# Patient Record
Sex: Male | Born: 1950
Health system: Southern US, Community
[De-identification: ages and names within clinical notes are randomized; demographics above are authoritative.]

## PROBLEM LIST (undated history)

## (undated) DIAGNOSIS — E785 Hyperlipidemia, unspecified: Secondary | ICD-10-CM

## (undated) DIAGNOSIS — I10 Essential (primary) hypertension: Secondary | ICD-10-CM

---

## 2004-03-18 ENCOUNTER — Ambulatory Visit: Payer: Self-pay | Admitting: Gastroenterology

## 2011-03-18 ENCOUNTER — Emergency Department
Admit: 2011-03-18 | Discharge: 2011-03-18 | Disposition: A | Discharge status: Home / Self Care | Payer: PRIVATE HEALTH INSURANCE

## 2011-03-18 ENCOUNTER — Emergency Department
Admission: EM | Admit: 2011-03-18 | Discharge: 2011-03-18 | Disposition: A | Discharge status: Home / Self Care | Payer: PRIVATE HEALTH INSURANCE | Source: Home / Self Care | Attending: Emergency Medicine | Admitting: Emergency Medicine

## 2011-03-18 DIAGNOSIS — R05 Cough: Secondary | ICD-10-CM

## 2011-03-18 DIAGNOSIS — R059 Cough, unspecified: Secondary | ICD-10-CM

## 2011-03-18 DIAGNOSIS — J209 Acute bronchitis, unspecified: Secondary | ICD-10-CM

## 2011-03-18 DIAGNOSIS — R0602 Shortness of breath: Secondary | ICD-10-CM

## 2011-03-18 HISTORY — DX: Hyperlipidemia, unspecified: E78.5

## 2011-03-18 MED ORDER — PREDNISONE (PAK) 10 MG PO TABS: 10.0000 mg | ORAL_TABLET | Freq: Every day | ORAL | Status: AC

## 2011-03-18 MED ORDER — ALBUTEROL SULFATE HFA 108 (90 BASE) MCG/ACT IN AERS: 1.0000 | INHALATION_SPRAY | Freq: Four times a day (QID) | RESPIRATORY_TRACT | Status: DC | PRN

## 2011-03-18 NOTE — ED Notes (Signed)
Currently on amoxicillin

## 2011-03-18 NOTE — ED Notes (Signed)
Sinus infection started 2 weeks ago, now having wheezing and coughing up yellow sputum

## 2011-03-18 NOTE — ED Provider Notes (Signed)
History     CSN: [id number] Arrival date & time: 03/18/2011 10:54 AM   First MD Initiated Contact with Patient 03/18/11 1049      Chief Complaint  Patient presents with  . Facial Pain    (Consider location/radiation/quality/duration/timing/severity/associated sxs/prior treatment) HPI William Lane is a 60 y.o. male who complains of onset of cold symptoms for 2-3 days. However, even before that he has had a lingering nasal congestion for the last 2-3 weeks. He went to the nurse at his work who gave him Augmentin for a sinus infection. However he is concerned that he may also have pneumonia since he has been wheezing and coughing a lot recently as well. He does not have any history of asthma or allergies. He is on day 3 of the antibiotics and thinks that the facial pressure symptoms are improving.  No sore throat + cough No pleuritic pain + wheezing + nasal congestion + post-nasal drainage + sinus pain/pressure + chest congestion No itchy/red eyes No earache No hemoptysis + SOB No chills/sweats No fever No nausea No vomiting No abdominal pain No diarrhea No skin rashes + fatigue No myalgias No headache    Past Medical History  Diagnosis Date  . Hyperlipidemia     No past surgical history on file.  No family history on file.  History  Substance Use Topics  . Smoking status: Never Smoker   . Smokeless tobacco: Not on file  . Alcohol Use: Yes      Review of Systems  Allergies  Review of patient's allergies indicates no known allergies.  Home Medications   Current Outpatient Rx  Name Route Sig Dispense Refill  . ATORVASTATIN CALCIUM 40 MG PO TABS Oral Take 40 mg by mouth daily.        BP 146/100  Pulse 81  Temp(Src) 98.7 F (37.1 C) (Oral)  Resp 20  Ht 5\' 10"  (1.778 m)  Wt 199 lb 8 oz (90.493 kg)  BMI 28.63 kg/m2  SpO2 96%  Physical Exam  Nursing note and vitals reviewed. Constitutional: He is oriented to person, place, and time. He appears  well-developed and well-nourished.  HENT:  Head: Normocephalic and atraumatic.  Right Ear: Tympanic membrane, external ear and ear canal normal.  Left Ear: Tympanic membrane, external ear and ear canal normal.  Nose: Mucosal edema and rhinorrhea present.  Mouth/Throat: Posterior oropharyngeal erythema present. No oropharyngeal exudate or posterior oropharyngeal edema.  Eyes: No scleral icterus.  Neck: Neck supple.  Cardiovascular: Regular rhythm and normal heart sounds.   Pulmonary/Chest: Effort normal. No respiratory distress. He has no decreased breath sounds. He has wheezes. He has rhonchi.  Neurological: He is alert and oriented to person, place, and time.  Skin: Skin is warm and dry.  Psychiatric: He has a normal mood and affect. His speech is normal.    ED Course  Procedures (including critical care time)  Labs Reviewed - No data to display No results found.   1. Cough   2. Shortness of breath   3. Bronchitis, acute       MDM  An X-ray was ordered and read by the radiologist as above. it reads as negative for pneumonia.  1)   continue the current antibiotic as already taking.  2)  Use nasal saline solution (over the counter) at least 3 times a day. 3)  Use over the counter decongestants like Zyrtec-D every 12 hours as needed to help with congestion.  If you have hypertension, do not take medicines  with sudafed.  4)  Can take tylenol every 6 hours or motrin every 8 hours for pain or fever. 5)  Follow up with your primary doctor if no improvement in 5-7 days, sooner if increasing pain, fever, or new symptoms.      Lily Kocher, MD 03/18/11 1130

## 2012-05-15 ENCOUNTER — Emergency Department
Admission: EM | Admit: 2012-05-15 | Discharge: 2012-05-15 | Disposition: A | Discharge status: Home / Self Care | Payer: PRIVATE HEALTH INSURANCE | Source: Home / Self Care | Attending: Family Medicine | Admitting: Family Medicine

## 2012-05-15 ENCOUNTER — Encounter: Payer: Self-pay | Admitting: Emergency Medicine

## 2012-05-15 DIAGNOSIS — J329 Chronic sinusitis, unspecified: Secondary | ICD-10-CM

## 2012-05-15 DIAGNOSIS — J309 Allergic rhinitis, unspecified: Secondary | ICD-10-CM

## 2012-05-15 HISTORY — DX: Essential (primary) hypertension: I10

## 2012-05-15 MED ORDER — FLUTICASONE PROPIONATE 50 MCG/ACT NA SUSP: 2.0000 | Freq: Every day | NASAL | Status: DC

## 2012-05-15 MED ORDER — AMOXICILLIN-POT CLAVULANATE 875-125 MG PO TABS: 1.0000 | ORAL_TABLET | Freq: Two times a day (BID) | ORAL | Status: DC

## 2012-05-15 NOTE — ED Notes (Signed)
Sinus pain, pressure, headache, sneezing, congestion x 3 days

## 2012-05-15 NOTE — ED Provider Notes (Signed)
History     CSN: [id number]  Arrival date & time 05/15/12  4098   First MD Initiated Contact with Patient 05/15/12 [phone]      Chief Complaint  Patient presents with  . Sinus Problem   HPI  URI Symptoms Onset: 3 days  Description: sinus drainage and pressure, sneezing.  Modifying factors:  Prior hx/o allergies. Started zyrtec with marked improvement in sxs.   Symptoms Nasal discharge: yes Fever: no Sore throat: no Cough: no Wheezing: no Ear pain: no GI symptoms: no Sick contacts: yes  Red Flags  Stiff neck: no Dyspnea: no Rash: no Swallowing difficulty: no  Sinusitis Risk Factors Headache/face pain: mild Double sickening: no tooth pain: no  Allergy Risk Factors Sneezing: yes Itchy scratchy throat: yes Seasonal symptoms: yes  Flu Risk Factors Headache: no muscle aches: no severe fatigue: no   Past Medical History  Diagnosis Date  . Hyperlipidemia   . Hypertension     History reviewed. No pertinent past surgical history.  Family History  Problem Relation Age of Onset  . Alzheimer's disease Mother     History  Substance Use Topics  . Smoking status: Never Smoker   . Smokeless tobacco: Not on file  . Alcohol Use: Yes      Review of Systems  All other systems reviewed and are negative.    Allergies  Review of patient's allergies indicates not on file.  Home Medications   Current Outpatient Rx  Name  Route  Sig  Dispense  Refill  . aspirin 81 MG tablet   Oral   Take 81 mg by mouth daily.         . cetirizine (ZYRTEC) 10 MG tablet   Oral   Take 10 mg by mouth daily.         . niacin (NIASPAN) 500 MG CR tablet   Oral   Take 500 mg by mouth at bedtime.         Marland Kitchen EXPIRED: albuterol (PROVENTIL HFA;VENTOLIN HFA) 108 (90 BASE) MCG/ACT inhaler   Inhalation   Inhale 1-2 puffs into the lungs every 6 (six) hours as needed for wheezing.   1 Inhaler   0   . amoxicillin-clavulanate (AUGMENTIN) 875-125 MG per tablet   Oral   Take  1 tablet by mouth 2 (two) times daily.   20 tablet   0   . atorvastatin (LIPITOR) 40 MG tablet   Oral   Take 40 mg by mouth daily.           . fluticasone (FLONASE) 50 MCG/ACT nasal spray   Nasal   Place 2 sprays into the nose daily.   16 g   12     BP 143/95  Pulse 86  Temp(Src) 97.8 F (36.6 C) (Oral)  Ht 5\' 10"  (1.778 m)  Wt 196 lb (88.905 kg)  BMI 28.12 kg/m2  SpO2 98%  Physical Exam  Constitutional: He appears well-developed and well-nourished.  HENT:  Head: Normocephalic and atraumatic.  Right Ear: External ear normal.  Left Ear: External ear normal.  +nasal erythema, rhinorrhea bilaterally, + post oropharyngeal erythema    Eyes: Conjunctivae are normal. Pupils are equal, round, and reactive to light.  Neck: Normal range of motion. Neck supple.  Cardiovascular: Normal rate, regular rhythm and normal heart sounds.   Pulmonary/Chest: Effort normal and breath sounds normal.  Abdominal: Soft. Bowel sounds are normal.  Musculoskeletal: Normal range of motion.  Lymphadenopathy:    He has no cervical adenopathy.  Neurological: He is alert.  Skin: Skin is warm.    ED Course  Procedures (including critical care time)  Labs Reviewed - No data to display No results found.   1. Allergic rhinitis   2. Sinusitis       MDM  Sxs predominantly allergic in origin.  Continue zyrtec.  Start flonase.  Augmentin if sxs not improved in next 5-7 days.  Discussed general care and infectious/resp red flags.  Follow up as needed.     The patient and/or caregiver has been counseled thoroughly with regard to treatment plan and/or medications prescribed including dosage, schedule, interactions, rationale for use, and possible side effects and they verbalize understanding. Diagnoses and expected course of recovery discussed and will return if not improved as expected or if the condition worsens. Patient and/or caregiver verbalized understanding.              Doree Albee, MD 05/15/12 [phone]

## 2012-08-01 LAB — BASIC METABOLIC PANEL
Creatinine: 1 mg/dL (ref 0.6–1.3)
Glucose: 98 mg/dL
POTASSIUM: 4.1 mmol/L (ref 3.4–5.3)

## 2012-08-01 LAB — HEMOGLOBIN A1C: HEMOGLOBIN A1C: 5.6 % (ref 4.0–6.0)

## 2012-11-29 IMAGING — CR DG CHEST 2V
2 series · 2 of 2 positions shown · non-contrast
Comparison: None.

CLINICAL DATA: Shortness of breath.  Cough, wheeze.  Rule out
pneumonia.  Symptoms for 2 weeks.

CHEST - 2 VIEW

[view not recorded (1 of 2)]
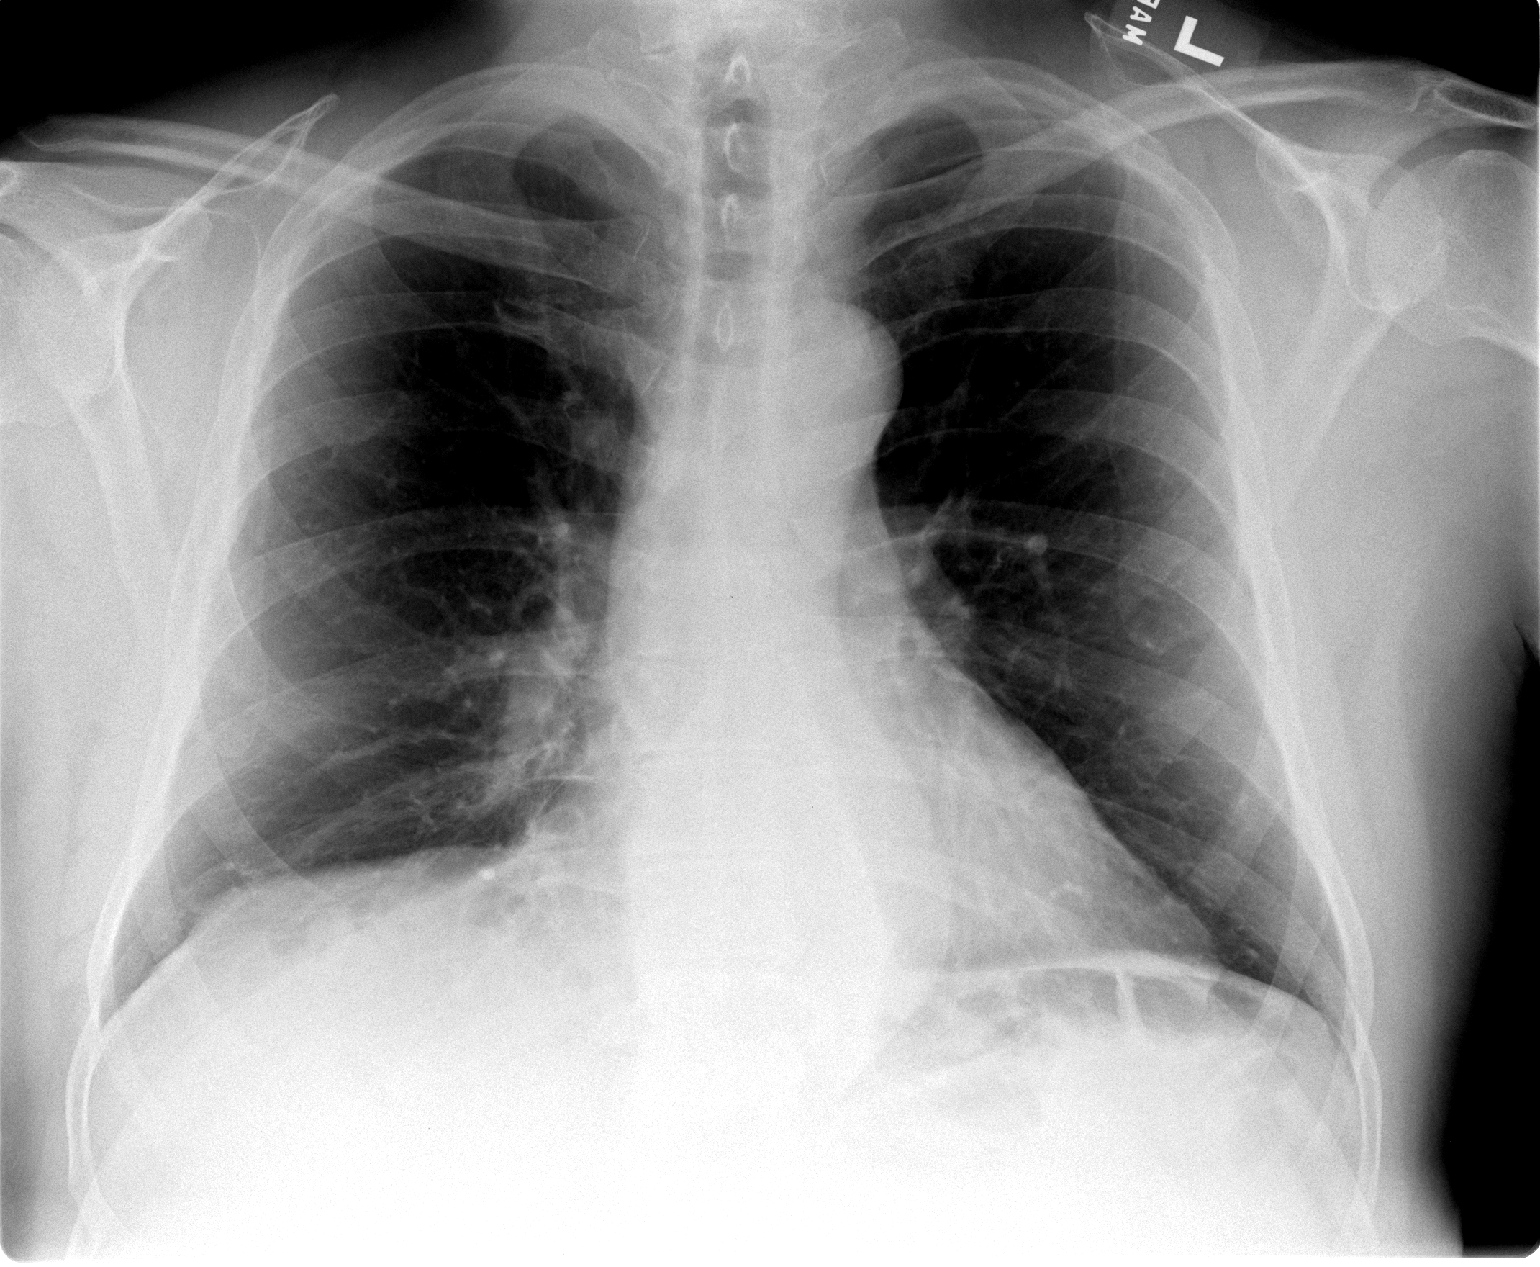

[view not recorded (2 of 2)]
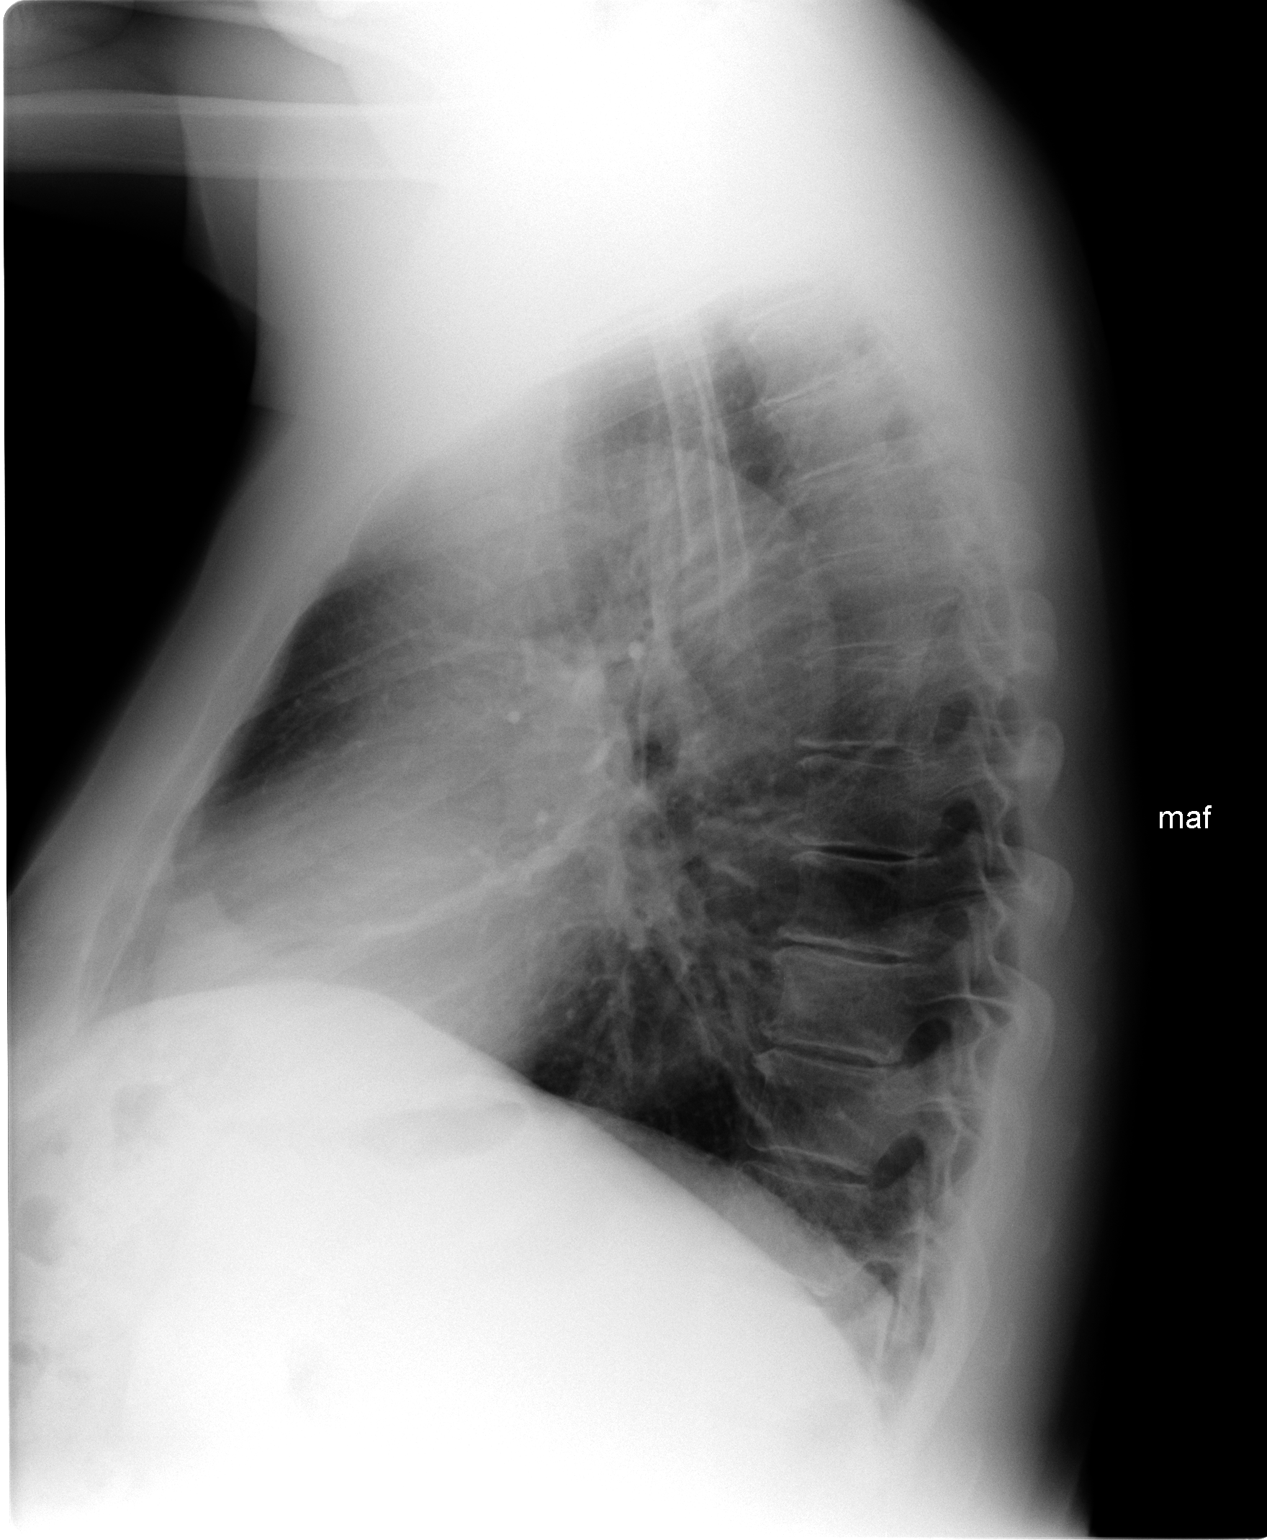

[2 of 2 positions shown; findings below may reference images not displayed]

FINDINGS: Cardiomediastinal silhouette is within normal limits.
The lungs are free of focal consolidations and pleural effusions.
No pulmonary edema.  Mild degenerate changes are seen in the mid
thoracic spine.
IMPRESSION: Negative exam.

## 2013-07-29 LAB — LIPID PANEL
CHOLESTEROL: 173 mg/dL (ref 0–200)
HDL: 63 mg/dL (ref 35–70)
LDL Cholesterol: 96 mg/dL
Triglycerides: 70 mg/dL (ref 40–160)

## 2013-07-29 LAB — PSA: PSA: 5.8

## 2013-07-29 LAB — HEPATIC FUNCTION PANEL
ALT: 29 U/L (ref 10–40)
AST: 30 U/L (ref 14–40)

## 2013-07-29 LAB — BASIC METABOLIC PANEL: Glucose: 106 mg/dL

## 2013-07-29 LAB — HEMOGLOBIN A1C: HEMOGLOBIN A1C: 5.5 % (ref 4.0–6.0)

## 2013-07-29 LAB — IRON: IRON: 159

## 2014-01-12 ENCOUNTER — Encounter: Payer: Self-pay | Admitting: Family Medicine

## 2014-01-12 ENCOUNTER — Ambulatory Visit (INDEPENDENT_AMBULATORY_CARE_PROVIDER_SITE_OTHER): Payer: BC Managed Care – PPO | Admitting: Family Medicine

## 2014-01-12 VITALS — BP 150/101 | HR 73 | Ht 70.0 in | Wt 199.0 lb

## 2014-01-12 DIAGNOSIS — E785 Hyperlipidemia, unspecified: Secondary | ICD-10-CM

## 2014-01-12 DIAGNOSIS — R972 Elevated prostate specific antigen [PSA]: Secondary | ICD-10-CM | POA: Insufficient documentation

## 2014-01-12 DIAGNOSIS — I10 Essential (primary) hypertension: Secondary | ICD-10-CM | POA: Diagnosis not present

## 2014-01-12 DIAGNOSIS — Z1211 Encounter for screening for malignant neoplasm of colon: Secondary | ICD-10-CM

## 2014-01-12 DIAGNOSIS — N400 Enlarged prostate without lower urinary tract symptoms: Secondary | ICD-10-CM

## 2014-01-12 LAB — VITAMIN D 25 HYDROXY (VIT D DEFICIENCY, FRACTURES): Vit D, 25-Hydroxy: 24

## 2014-01-12 MED ORDER — DOXAZOSIN MESYLATE 2 MG PO TABS: 2.0000 mg | ORAL_TABLET | Freq: Every day | ORAL | Status: DC

## 2014-01-12 NOTE — Progress Notes (Signed)
CC: William Lane is a 63 y.o. male is here for Establish Care   Subjective: HPI:  Pleasant 63 year old here to establish care. "Rennie like grow"  Reports a long-standing history of hyperlipidemia. He was once on atorvastatin 80 mg daily however developed diffuse joint pain. His cholesterol was well controlled on 40 mg daily however 3 months ago he decided to take only 40 mg 3 times a week. He has not had blood work done since this change. Denies joint pain or myalgias currently. Denies right upper quadrant pain nor chest pain  Present history of borderline hypertension. He's never been on anything the lower blood pressure before. He states active on a daily basis playing golf. Denies chest pain shortness of breath orthopnea nor peripheral edema  Reports a history of elevated PSA most recently 5-1/2. He has a history of this being between 3-4 and with this recent elevation had a prostate biopsy which was normal. He reports urinary urgency weak stream and a sensation of incomplete bladder emptying on a daily basis to a moderate degree there has been present for months and was present prior to his biopsy.  Review of Systems - General ROS: negative for - chills, fever, night sweats, weight gain or weight loss Ophthalmic ROS: negative for - decreased vision Psychological ROS: negative for - anxiety or depression ENT ROS: negative for - hearing change, nasal congestion, tinnitus or allergies Hematological and Lymphatic ROS: negative for - bleeding problems, bruising or swollen lymph nodes Breast ROS: negative Respiratory ROS: no cough, shortness of breath, or wheezing Cardiovascular ROS: no chest pain or dyspnea on exertion Gastrointestinal ROS: no abdominal pain, change in bowel habits, or black or bloody stools Genito-Urinary ROS: negative for - genital discharge, genital ulcers, incontinence or abnormal bleeding from genitals Musculoskeletal ROS: negative for - joint pain or muscle  pain Neurological ROS: negative for - headaches or memory loss Dermatological ROS: negative for lumps, mole changes, rash and skin lesion changes  Past Medical History  Diagnosis Date  . Hyperlipidemia   . Hypertension     History reviewed. No pertinent past surgical history. Family History  Problem Relation Age of Onset  . Alzheimer's disease Mother   . Hyperlipidemia      History   Social History  . Marital Status: Married    Spouse Name: N/A    Number of Children: N/A  . Years of Education: N/A   Occupational History  . Not on file.   Social History Main Topics  . Smoking status: Never Smoker   . Smokeless tobacco: Not on file  . Alcohol Use: 5.0 oz/week    10 drink(s) per week  . Drug Use: No  . Sexual Activity: Yes   Other Topics Concern  . Not on file   Social History Narrative  . No narrative on file     Objective: BP 150/101  Pulse 73  Ht 5\' 10"  (1.778 m)  Wt 199 lb (90.266 kg)  BMI 28.55 kg/m2  General: Alert and Oriented, No Acute Distress HEENT: Pupils equal, round, reactive to light. Conjunctivae clear.  External ears unremarkable, canals clear with intact TMs with appropriate landmarks.  Middle ear appears open without effusion. Pink inferior turbinates.  Moist mucous membranes, pharynx without inflammation nor lesions.  Neck supple without palpable lymphadenopathy nor abnormal masses. Lungs: Clear to auscultation bilaterally, no wheezing/ronchi/rales.  Comfortable work of breathing. Good air movement. Cardiac: Regular rate and rhythm. Normal S1/S2.  No murmurs, rubs, nor gallops.  Mental Status: No depression, anxiety, nor agitation. Skin: Warm and dry.  Assessment & Plan: Dorinda HillDonald was seen today for establish care.  Diagnoses and associated orders for this visit:  Colon cancer screening - Ambulatory referral to Gastroenterology  Hyperlipidemia - Lipid panel  Essential hypertension - doxazosin (CARDURA) 2 MG tablet; Take 1 tablet (2 mg  total) by mouth daily.  Elevated PSA - PSA  BPH (benign prostatic hyperplasia) - doxazosin (CARDURA) 2 MG tablet; Take 1 tablet (2 mg total) by mouth daily.    It's been 10 years since his last colon cancer screening, we'll refer him to a local GI office for consideration of colonoscopy Hyperlipidemia: Due for repeat lipid panel continue 40 mg 2 times a day pending results Essential hypertension: Uncontrolled starting doxazosin. BPH  With a history of elevated PSA: Repeat PSA today, doxazosin to help with enlarged prostate symptoms.   Return in about 3 months (around 04/14/2014) for BP Follow Up.

## 2014-01-22 ENCOUNTER — Encounter: Payer: Self-pay | Admitting: Family Medicine

## 2014-02-03 ENCOUNTER — Other Ambulatory Visit: Payer: Self-pay | Admitting: Family Medicine

## 2014-02-03 LAB — LIPID PANEL
CHOL/HDL RATIO: 3.6 ratio
Cholesterol: 212 mg/dL — ABNORMAL HIGH (ref 0–200)
HDL: 59 mg/dL (ref >39)
LDL CALC: 129 mg/dL — AB (ref 0–99)
Triglycerides: 119 mg/dL (ref <150)
VLDL: 24 mg/dL (ref 0–40)

## 2014-02-03 LAB — PSA: PSA: 6.24 ng/mL — ABNORMAL HIGH (ref <=4.00)

## 2014-02-19 ENCOUNTER — Encounter: Payer: Self-pay | Admitting: Family Medicine

## 2014-04-04 ENCOUNTER — Encounter: Payer: Self-pay | Admitting: Family Medicine

## 2014-04-10 ENCOUNTER — Encounter: Payer: Self-pay | Admitting: Family Medicine

## 2014-04-13 ENCOUNTER — Ambulatory Visit (INDEPENDENT_AMBULATORY_CARE_PROVIDER_SITE_OTHER): Payer: BLUE CROSS/BLUE SHIELD | Admitting: Family Medicine

## 2014-04-13 ENCOUNTER — Encounter: Payer: Self-pay | Admitting: Family Medicine

## 2014-04-13 VITALS — BP 143/96 | HR 64 | Ht 70.0 in | Wt 204.0 lb

## 2014-04-13 DIAGNOSIS — I10 Essential (primary) hypertension: Secondary | ICD-10-CM

## 2014-04-13 DIAGNOSIS — E785 Hyperlipidemia, unspecified: Secondary | ICD-10-CM

## 2014-04-13 DIAGNOSIS — N400 Enlarged prostate without lower urinary tract symptoms: Secondary | ICD-10-CM | POA: Diagnosis not present

## 2014-04-13 LAB — LIPID PANEL
CHOL/HDL RATIO: 2.9 ratio
CHOLESTEROL: 180 mg/dL (ref 0–200)
HDL: 63 mg/dL (ref >39)
LDL Cholesterol: 104 mg/dL — ABNORMAL HIGH (ref 0–99)
Triglycerides: 63 mg/dL (ref <150)
VLDL: 13 mg/dL (ref 0–40)

## 2014-04-13 MED ORDER — DOXAZOSIN MESYLATE 4 MG PO TABS: 4.0000 mg | ORAL_TABLET | Freq: Every day | ORAL | Status: DC

## 2014-04-13 MED ORDER — NIACIN ER (ANTIHYPERLIPIDEMIC) 1000 MG PO TBCR: 1000.0000 mg | EXTENDED_RELEASE_TABLET | Freq: Every day | ORAL | Status: DC

## 2014-04-13 NOTE — Progress Notes (Signed)
CC: Horatio PelDonald S Eakins is a 64 y.o. male is here for Follow-up   Subjective: HPI:  Follow-up BPH: Denies any sensation of incomplete voiding or difficulty voiding. Awakening at most once a night to urinate. Has No genitourinary complaints today.  Follow essential hypertension: No outside blood pressures report. Since taking doxazosin he denies any known side effects or intolerance. Denies lightheadedness, chest pain, shortness of breath, orthopnea nor peripheral edema. Denies motor or sensory disturbances.  Since I saw him last he began taking atorvastatin 40 mg on a daily basis instead of 3 times a week. He is also taking niacin on a daily basis instead of 3 times a week. He denies any myalgias or right upper quadrant pain. Denies flushing provided he takes aspirin 30 minutes prior to niacin.  Review Of Systems Outlined In HPI  Past Medical History  Diagnosis Date  . Hyperlipidemia   . Hypertension     No past surgical history on file. Family History  Problem Relation Age of Onset  . Alzheimer's disease Mother   . Hyperlipidemia      History   Social History  . Marital Status: Married    Spouse Name: N/A    Number of Children: N/A  . Years of Education: N/A   Occupational History  . Not on file.   Social History Main Topics  . Smoking status: Never Smoker   . Smokeless tobacco: Not on file  . Alcohol Use: 5.0 oz/week    10 drink(s) per week  . Drug Use: No  . Sexual Activity: Yes   Other Topics Concern  . Not on file   Social History Narrative     Objective: BP 143/96 mmHg  Pulse 64  Ht 5\' 10"  (1.778 m)  Wt 204 lb (92.534 kg)  BMI 29.27 kg/m2  General: Alert and Oriented, No Acute Distress HEENT: Pupils equal, round, reactive to light. Conjunctivae clear.  Moist mucous membranes Lungs: Clear to auscultation bilaterally, no wheezing/ronchi/rales.  Comfortable work of breathing. Good air movement. Cardiac: Regular rate and rhythm. Normal S1/S2.  No murmurs,  rubs, nor gallops.  No carotid bruits Extremities: No peripheral edema.  Strong peripheral pulses.  Mental Status: No depression, anxiety, nor agitation. Skin: Warm and dry.  Assessment & Plan: Dorinda HillDonald was seen today for follow-up.  Diagnoses and associated orders for this visit:  BPH (benign prostatic hyperplasia) - doxazosin (CARDURA) 4 MG tablet; Take 1 tablet (4 mg total) by mouth daily.  Essential hypertension - doxazosin (CARDURA) 4 MG tablet; Take 1 tablet (4 mg total) by mouth daily.  Hyperlipidemia - Lipid panel - niacin (NIASPAN) 1000 MG CR tablet; Take 1 tablet (1,000 mg total) by mouth at bedtime.    BPH: Controlled continue doxazosin however increasing due to uncontrolled hypertension. Hypertension: Uncontrolled chronic condition increasing doxazosin Hyperlipidemia: Check a lipid panel continue atorvastatin and niacin on a daily basis pending results   Return in about 3 months (around 07/13/2014) for Blood Pressure.

## 2014-06-19 ENCOUNTER — Other Ambulatory Visit: Payer: Self-pay | Admitting: Family Medicine

## 2014-08-06 ENCOUNTER — Telehealth: Payer: Self-pay | Admitting: Family Medicine

## 2014-08-06 DIAGNOSIS — E559 Vitamin D deficiency, unspecified: Secondary | ICD-10-CM | POA: Insufficient documentation

## 2014-08-06 MED ORDER — VITAMIN D (ERGOCALCIFEROL) 1.25 MG (50000 UNIT) PO CAPS: 50000.0000 [IU] | ORAL_CAPSULE | ORAL | Status: DC

## 2014-08-06 NOTE — Telephone Encounter (Signed)
William Lane, Will you please let patient know that I appreciate him getting the [facility] results to me this week.  Everything looks stable other than a vitamin D deficiency therefore [facility] [address].

## 2014-08-06 NOTE — Telephone Encounter (Signed)
Message left on vm 

## 2014-10-08 ENCOUNTER — Other Ambulatory Visit: Payer: Self-pay | Admitting: Family Medicine

## 2014-10-23 ENCOUNTER — Other Ambulatory Visit: Payer: Self-pay | Admitting: Family Medicine

## 2014-11-07 ENCOUNTER — Encounter: Payer: Self-pay | Admitting: Emergency Medicine

## 2014-11-07 ENCOUNTER — Emergency Department
Admission: EM | Admit: 2014-11-07 | Discharge: 2014-11-07 | Disposition: A | Discharge status: Home / Self Care | Payer: BLUE CROSS/BLUE SHIELD | Source: Home / Self Care | Attending: Family Medicine | Admitting: Family Medicine

## 2014-11-07 DIAGNOSIS — L255 Unspecified contact dermatitis due to plants, except food: Secondary | ICD-10-CM | POA: Diagnosis not present

## 2014-11-07 MED ORDER — TRIAMCINOLONE ACETONIDE 40 MG/ML IJ SUSP
40.0000 mg | Freq: Once | INTRAMUSCULAR | Status: AC
  Administered 2014-11-07: 40 mg via INTRAMUSCULAR

## 2014-11-07 NOTE — ED Notes (Signed)
Patient presents to [facility] with C/O [facility], he states 3 to 4 days ago [place]. Rash noted to hands arms legs and trunk

## 2014-11-07 NOTE — ED Notes (Signed)
Patient discharged with instructions and verbalized an understanding.

## 2014-11-07 NOTE — ED Provider Notes (Signed)
CSN: [id number]     Arrival date & time 11/07/14  0917 History   First MD Initiated Contact with Patient 11/07/14 [phone]     Chief Complaint  Patient presents with  . 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  He has developed a pruritic rash on his arms, lower legs, and abdomen.  Patient is a 64 y.o. Lane presenting with poison ivy. The history is provided by the patient.  Poison [place] This is a new problem. The current episode started more than 2 days ago. The problem occurs constantly. The problem has not changed since onset.Associated symptoms comments: none. Exacerbated by: heat. Nothing relieves the symptoms. He has tried nothing for the symptoms.    Past Medical History  Diagnosis Date  . Hyperlipidemia   . Hypertension    History reviewed. No pertinent past surgical history. Family History  Problem Relation Age of Onset  . Alzheimer's disease Mother   . Hyperlipidemia     History  Substance Use Topics  . Smoking status: Never Smoker   . Smokeless tobacco: Not on file  . Alcohol Use: 5.0 oz/week    10 drink(s) per week    Review of Systems  All other systems reviewed and are negative.   Allergies  Review of patient's allergies indicates no known allergies.  Home Medications   Prior to Admission medications   Medication Sig Start Date End Date Taking? Authorizing Provider  aspirin 81 MG tablet Take 81 mg by mouth daily.    Historical Provider, MD  atorvastatin (LIPITOR) William MG tablet Take 1 tablet (William mg total) by mouth daily. 02/03/14   Sean Hommel, DO  atorvastatin (LIPITOR) 80 MG tablet TAKE 1 TABLET BY MOUTH ONCE A DAY 06/19/14   Sean Hommel, DO  doxazosin (CARDURA) 4 MG tablet TAKE 1 TABLET (4 MG TOTAL) BY MOUTH DAILY. 10/08/14   Laren Boom, DO  niacin (NIASPAN) 1000 MG CR tablet Take 1 tablet (1,000 mg total) by mouth at bedtime. 04/13/14   Laren Boom, DO  Vitamin D, Ergocalciferol,  (DRISDOL) 50000 UNITS CAPS capsule TAKE 1 CAPSULE (50,000 UNITS TOTAL) BY MOUTH EVERY 7 (SEVEN) DAYS. RECHECK VITAMIN D IN 3 MONTHS 10/23/14   Sean Hommel, DO   BP 145/96 mmHg  Pulse 54  Temp(Src) 98.6 F (37 C) (Oral)  Ht 5\' 10"  (1.778 m)  Wt 190 lb (86.183 kg)  BMI 27.26 kg/m2  SpO2 98% Physical Exam  Constitutional: He is oriented to person, place, and time. He appears well-developed and well-nourished. No distress.  HENT:  Head: Normocephalic.  Right Ear: External ear normal.  Left Ear: External ear normal.  Nose: Nose normal.  Mouth/Throat: Oropharynx is clear and moist.  Eyes: Conjunctivae are normal. Pupils are equal, round, and reactive to light.  Cardiovascular: Normal heart sounds.   Pulmonary/Chest: Breath sounds normal.  Abdominal: There is no tenderness.  Musculoskeletal: He exhibits no edema.  Lymphadenopathy:    He has no cervical adenopathy.  Neurological: He is alert and oriented to person, place, and time.  Skin: Skin is warm and dry. Rash noted. Rash is maculopapular. Rash is not pustular and not vesicular.     Scattered maculopapular erythematous lesions on forearms, abdomen, and lower legs.  No evidence cellulitis.  No involvement of face.  Nursing note and vitals reviewed.   ED Course  Procedures  none   MDM  1. Rhus dermatitis    Kenalog  IM May take Benadryl  at bedtime if needed for itching. Call if not improving 3 to 4 days (could add oral prednisone).    Lattie Haw, MD 11/07/14 [phone]

## 2014-11-07 NOTE — Discharge Instructions (Signed)
May take Benadryl  at bedtime if needed for itching.   Poison [employer] ivy is a inflammation of the skin (contact dermatitis) caused by touching the allergens on the leaves of the ivy plant following previous exposure to the plant. The rash usually appears 48 hours after exposure. The rash is usually bumps (papules) or blisters (vesicles) in a linear pattern. Depending on your own sensitivity, the rash may simply cause redness and itching, or it may also progress to blisters which may break open. These must be well cared for to prevent secondary bacterial (germ) infection, followed by scarring. Keep any open areas dry, clean, dressed, and covered with an antibacterial ointment if needed. The eyes may also get puffy. The puffiness is worst in the morning and gets better as the day progresses. This dermatitis usually heals without scarring, within 2 to 3 weeks without treatment. HOME CARE INSTRUCTIONS  Thoroughly wash with soap and water as soon as you have been exposed to poison ivy. You have about one half hour to remove the plant resin before it will cause the rash. This washing will destroy the oil or antigen on the skin that is causing, or will cause, the rash. Be sure to wash under your fingernails as any plant resin there will continue to spread the rash. Do not rub skin vigorously when washing affected area. Poison ivy cannot spread if no oil from the plant remains on your body. A rash that has progressed to weeping sores will not spread the rash unless you have not washed thoroughly. It is also important to wash any clothes you have been wearing as these may carry active allergens. The rash will return if you wear the unwashed clothing, even several days later. Avoidance of the plant in the future is the best measure. Poison ivy plant can be recognized by the number of leaves. Generally, poison ivy has three leaves with flowering branches on a single stem. Diphenhydramine may be purchased over  the counter and used as needed for itching. Do not drive with this medication if it makes you drowsy.Ask your caregiver about medication for children. SEEK MEDICAL CARE IF:  Open sores develop.  Redness spreads beyond area of rash.  You notice purulent (pus-like) discharge.  You have increased pain.  Other signs of infection develop (such as fever). Document Released: 03/17/2000 Document Revised: 06/12/2011 Document Reviewed: 08/28/2008 [employer] Patient Information 2015 [place], [place]. This information is not intended to replace advice given to you by your health care provider. Make sure you discuss any questions you have with your health care provider.

## 2014-11-10 ENCOUNTER — Telehealth: Payer: Self-pay | Admitting: Family Medicine

## 2014-11-10 MED ORDER — DOXAZOSIN MESYLATE 8 MG PO TABS: 8.0000 mg | ORAL_TABLET | Freq: Every day | ORAL | Status: DC

## 2014-11-10 NOTE — Telephone Encounter (Signed)
William Lane, New  dose sent to his pharmacy. F/u with me in one month.

## 2014-11-10 NOTE — Telephone Encounter (Signed)
Left message on vm

## 2014-11-10 NOTE — Telephone Encounter (Signed)
Patient came in and states that he feels like the Doxazosin Mesylate 4 mg is not working.  He was in [facility] on Sat and his BP was 146/96.  He said that you mentioned he could take a higher dose if the  was not working.  Can you send over increased dose to [facility]?  thanks

## 2015-01-13 ENCOUNTER — Other Ambulatory Visit: Payer: Self-pay | Admitting: Family Medicine

## 2015-01-25 ENCOUNTER — Ambulatory Visit (INDEPENDENT_AMBULATORY_CARE_PROVIDER_SITE_OTHER): Payer: BLUE CROSS/BLUE SHIELD | Admitting: Family Medicine

## 2015-01-25 ENCOUNTER — Encounter: Payer: Self-pay | Admitting: Family Medicine

## 2015-01-25 VITALS — BP 139/88 | HR 63 | Wt 197.0 lb

## 2015-01-25 DIAGNOSIS — E785 Hyperlipidemia, unspecified: Secondary | ICD-10-CM

## 2015-01-25 DIAGNOSIS — N400 Enlarged prostate without lower urinary tract symptoms: Secondary | ICD-10-CM

## 2015-01-25 DIAGNOSIS — I10 Essential (primary) hypertension: Secondary | ICD-10-CM

## 2015-01-25 DIAGNOSIS — Z1211 Encounter for screening for malignant neoplasm of colon: Secondary | ICD-10-CM | POA: Diagnosis not present

## 2015-01-25 MED ORDER — ATORVASTATIN CALCIUM 40 MG PO TABS: 40.0000 mg | ORAL_TABLET | Freq: Every day | ORAL | Status: DC

## 2015-01-25 MED ORDER — DOXAZOSIN MESYLATE 8 MG PO TABS: 8.0000 mg | ORAL_TABLET | Freq: Every day | ORAL | Status: DC

## 2015-01-25 NOTE — Progress Notes (Signed)
CC: William Lane is a 64 y.o. male is here for Hypertension   Subjective: HPI:  Follow-up essential hypertension: Taking doxazosin on a daily basis. His pharmacy excellently filled a 4 mg formulation however he's been taking 2 a day. Outside blood pressures have all been in the prehypertensive or normotensive state. He denies chest pain shortness of breath orthopnea or peripheral edema. He staying active by playing golf most days of the week.  Follow-up BPH: He is no longer waking at all at night to urinate. He denies a weak stream or urinary hesitancy. He is very happy with his current doxazosin regimen.  Follow-up hyperlipidemia: He is taking 40 mg of atorvastatin on a daily basis. He is getting an 80 mg formulation and cutting in half but he wants noting a 40 mg formulation because it's annoying to physically break it in half. He denies right upper quadrant pain or myalgias.   Review Of Systems Outlined In HPI  Past Medical History  Diagnosis Date  . Hyperlipidemia   . Hypertension     No past surgical history on file. Family History  Problem Relation Age of Onset  . Alzheimer's disease Mother   . Hyperlipidemia      Social History   Social History  . Marital Status: Married    Spouse Name: N/A  . Number of Children: N/A  . Years of Education: N/A   Occupational History  . Not on file.   Social History Main Topics  . Smoking status: Never Smoker   . Smokeless tobacco: Not on file  . Alcohol Use: 5.0 oz/week    10 drink(s) per week  . Drug Use: No  . Sexual Activity: Yes   Other Topics Concern  . Not on file   Social History Narrative     Objective: BP 139/88 mmHg  Pulse 63  Wt 197 lb (89.359 kg)  General: Alert and Oriented, No Acute Distress HEENT: Pupils equal, round, reactive to light. Conjunctivae clear.  Moist mucous membranes Lungs: Clear to auscultation bilaterally, no wheezing/ronchi/rales.  Comfortable work of breathing. Good air  movement. Cardiac: Regular rate and rhythm. Normal S1/S2.  No murmurs, rubs, nor gallops.   Extremities: No peripheral edema.  Strong peripheral pulses.  Mental Status: No depression, anxiety, nor agitation. Skin: Warm and dry.  Assessment & Plan: William Lane was seen today for hypertension.  Diagnoses and all orders for this visit:  Essential hypertension -     atorvastatin (LIPITOR) 40 MG tablet; Take 1 tablet (40 mg total) by mouth daily.  BPH (benign prostatic hyperplasia) -     doxazosin (CARDURA) 8 MG tablet; Take 1 tablet (8 mg total) by mouth daily. -     atorvastatin (LIPITOR) 40 MG tablet; Take 1 tablet (40 mg total) by mouth daily.  Special screening for malignant neoplasms, colon -     Ambulatory referral to Gastroenterology  Hyperlipidemia   Hyperlipidemia: Currently controlled he'll be due for lipid panel in the winter continue atorvastatin at 40 mg daily Essential hypertension: Controlled continue 8 milligrams of doxazosin daily BPH: Controlled in a symptomatically continue doxazosin  Return in about 3 months (around 04/27/2015) for BPH and Blood Pressure.

## 2015-03-09 ENCOUNTER — Encounter: Payer: Self-pay | Admitting: Family Medicine

## 2015-03-09 DIAGNOSIS — Z86010 Personal history of colonic polyps: Secondary | ICD-10-CM | POA: Insufficient documentation

## 2015-03-15 ENCOUNTER — Encounter: Payer: Self-pay | Admitting: Family Medicine

## 2015-03-15 DIAGNOSIS — M1611 Unilateral primary osteoarthritis, right hip: Secondary | ICD-10-CM | POA: Insufficient documentation

## 2015-03-19 ENCOUNTER — Other Ambulatory Visit: Payer: Self-pay | Admitting: Family Medicine

## 2015-03-23 ENCOUNTER — Encounter: Payer: Self-pay | Admitting: Family Medicine

## 2015-03-23 ENCOUNTER — Ambulatory Visit (INDEPENDENT_AMBULATORY_CARE_PROVIDER_SITE_OTHER): Payer: BLUE CROSS/BLUE SHIELD | Admitting: Family Medicine

## 2015-03-23 ENCOUNTER — Telehealth: Payer: Self-pay | Admitting: Family Medicine

## 2015-03-23 VITALS — BP 129/82 | HR 59 | Wt 196.0 lb

## 2015-03-23 DIAGNOSIS — M1611 Unilateral primary osteoarthritis, right hip: Secondary | ICD-10-CM | POA: Diagnosis not present

## 2015-03-23 DIAGNOSIS — M5136 Other intervertebral disc degeneration, lumbar region: Secondary | ICD-10-CM

## 2015-03-23 DIAGNOSIS — N400 Enlarged prostate without lower urinary tract symptoms: Secondary | ICD-10-CM

## 2015-03-23 DIAGNOSIS — E785 Hyperlipidemia, unspecified: Secondary | ICD-10-CM | POA: Diagnosis not present

## 2015-03-23 MED ORDER — DOXAZOSIN MESYLATE 8 MG PO TABS: 8.0000 mg | ORAL_TABLET | Freq: Every day | ORAL | Status: DC

## 2015-03-23 MED ORDER — CELECOXIB 200 MG PO CAPS: 200.0000 mg | ORAL_CAPSULE | Freq: Two times a day (BID) | ORAL | Status: DC | PRN

## 2015-03-23 MED ORDER — NIACIN ER (ANTIHYPERLIPIDEMIC) 1000 MG PO TBCR: 1000.0000 mg | EXTENDED_RELEASE_TABLET | Freq: Every day | ORAL | Status: DC

## 2015-03-23 MED ORDER — MELOXICAM 15 MG PO TABS: 15.0000 mg | ORAL_TABLET | Freq: Every day | ORAL | Status: DC

## 2015-03-23 NOTE — Telephone Encounter (Signed)
Will you please let patient know that his insurance will not cover celebrex unless he fails to respond to a cheaper NSAID first.  I"ll send in a new anti-inflammatory called meloxicam.  Please let me know ASAP if it does not help.

## 2015-03-23 NOTE — Progress Notes (Signed)
CC: William Lane is a 64 y.o. male is here for Hip Pain   Subjective: HPI:  Follow-up hyperlipidemia: He is requesting a refill on niacin. Is exercising every day of the week. He denies any chest pain shortness of breath orthopnea nor peripheral edema. Denies leg claudication. He continues to take  atorvastatin on a daily basis without myalgias.  Follow-up BPH: No difficulty with initiating stream. He denies polyuria. Not awakening at night to urinate.  For over 3 months now he's been experiencing right groin pain. He's been ignoring it until it got in the way of his golf skills earlier this month. He is currently seeing a chiropractor who got x-rays that revealed significant osteoarthritis in the right hip. He wants to know if there is medication he can take to help with this pain. Denies any motor or sensory disturbances.    Review Of Systems Outlined In HPI  Past Medical History  Diagnosis Date  . Hyperlipidemia   . Hypertension     No past surgical history on file. Family History  Problem Relation Age of Onset  . Alzheimer's disease Mother   . Hyperlipidemia      Social History   Social History  . Marital Status: Married    Spouse Name: N/A  . Number of Children: N/A  . Years of Education: N/A   Occupational History  . Not on file.   Social History Main Topics  . Smoking status: Never Smoker   . Smokeless tobacco: Not on file  . Alcohol Use: 5.0 oz/week    10 drink(s) per week  . Drug Use: No  . Sexual Activity: Yes   Other Topics Concern  . Not on file   Social History Narrative     Objective: BP 129/82 mmHg  Pulse 59  Wt 196 lb (88.905 kg)  General: Alert and Oriented, No Acute Distress HEENT: Pupils equal, round, reactive to light. Conjunctivae clear.  Moist mucous membranes Lungs: Clear to auscultation bilaterally, no wheezing/ronchi/rales.  Comfortable work of breathing. Good air movement. Cardiac: Regular rate and rhythm. Normal S1/S2.  No  murmurs, rubs, nor gallops.   Extremities: No peripheral edema.  Strong peripheral pulses. Full range of motion and strength in the right lower extremity. No pain with internal or external rotation of the femur. Mental Status: No depression, anxiety, nor agitation. Skin: Warm and dry.  Assessment & Plan: William Lane was seen today for hip pain.  Diagnoses and all orders for this visit:  Hyperlipidemia -     niacin (NIASPAN) 1000 MG CR tablet; Take 1 tablet (1,000 mg total) by mouth at bedtime.  BPH (benign prostatic hyperplasia) -     doxazosin (CARDURA) 8 MG tablet; Take 1 tablet (8 mg total) by mouth daily.  Primary osteoarthritis of right hip -     celecoxib (CELEBREX) 200 MG capsule; Take 1 capsule (200 mg total) by mouth 2 (two) times daily as needed (pain).  Degenerative disc disease, lumbar   Hyperlipidemia: Clinical control due for repeat lipid panel next month. Continue atorvastatin and niacin. BPH: Chronically controlled continue Cardura,  Clarified that he should be taking 8 mg daily instead of 4 mg that was accidentally sent in earlier this month. Osteoarthritis of the hip: Start Celebrex, continue to stay active. Hopefully this will help with some degenerative disc disease that was seen also on x-rays this month.   Return in about 3 months (around 06/21/2015).

## 2015-03-23 NOTE — Telephone Encounter (Signed)
Pt.notified

## 2015-04-03 ENCOUNTER — Other Ambulatory Visit: Payer: Self-pay | Admitting: Family Medicine

## 2015-04-09 ENCOUNTER — Telehealth: Payer: Self-pay

## 2015-04-09 MED ORDER — CELECOXIB 200 MG PO CAPS: 200.0000 mg | ORAL_CAPSULE | Freq: Two times a day (BID) | ORAL | Status: DC | PRN

## 2015-04-09 NOTE — Telephone Encounter (Signed)
Patient called and left a message stating the meloxicam is not helping with the pain. Please advise.

## 2015-04-09 NOTE — Telephone Encounter (Signed)
Raechel AcheEVonia, Will you please let patient know that I've resubitted a Rx for celebrex which is what I initially recommended but his insurance wanted him to try a cheaper alternative first.

## 2015-04-09 NOTE — Telephone Encounter (Signed)
Pt.notified

## 2015-04-27 ENCOUNTER — Ambulatory Visit: Payer: BLUE CROSS/BLUE SHIELD | Admitting: Family Medicine

## 2015-06-21 ENCOUNTER — Encounter: Payer: Self-pay | Admitting: Family Medicine

## 2015-06-21 ENCOUNTER — Ambulatory Visit (INDEPENDENT_AMBULATORY_CARE_PROVIDER_SITE_OTHER): Payer: BLUE CROSS/BLUE SHIELD | Admitting: Family Medicine

## 2015-06-21 VITALS — BP 159/107 | HR 66 | Ht 70.0 in | Wt 206.0 lb

## 2015-06-21 DIAGNOSIS — M1611 Unilateral primary osteoarthritis, right hip: Secondary | ICD-10-CM | POA: Diagnosis not present

## 2015-06-21 DIAGNOSIS — N400 Enlarged prostate without lower urinary tract symptoms: Secondary | ICD-10-CM | POA: Diagnosis not present

## 2015-06-21 DIAGNOSIS — I10 Essential (primary) hypertension: Secondary | ICD-10-CM

## 2015-06-21 DIAGNOSIS — R972 Elevated prostate specific antigen [PSA]: Secondary | ICD-10-CM

## 2015-06-21 LAB — LIPID PANEL
Cholesterol: 202 mg/dL — ABNORMAL HIGH (ref 125–200)
HDL: 78 mg/dL (ref >=40)
LDL CALC: 105 mg/dL (ref <130)
TRIGLYCERIDES: 97 mg/dL (ref <150)
Total CHOL/HDL Ratio: 2.6 Ratio (ref <=5.0)
VLDL: 19 mg/dL (ref <30)

## 2015-06-21 NOTE — Progress Notes (Signed)
CC: William Lane is a [age] y.o. male is here for Hyperlipidemia   Subjective: HPI:  Follow-up BPH: Taking doxazosin with no known prostate symptoms or complaints. He denies waking up to urinate in the middle the night or any difficulty with urinary hesitancy or urgency. He denies any sensation of incomplete voiding.  Continued right hip pain. He tells me that symptoms are bad enough to where he no longer is able to golf 2 consecutive days. He localizes the pain to the groin and radiates to the back of the hip. Walking seems to make it worse if he's been walking for long periods of time. Nothing else seems to make symptoms worse but it does improve greatly with taking Celebrex once a day. He's never taken it twice a day. He denies any known side effects from Celebrex.  Follow-up essential hypertension: No chest pain shortness of breath orthopnea nor peripheral edema. No outside blood pressures report.  He has a history of an elevated PSA with a normal prostate biopsy when his PSA was 5. He denies any unintentional weight loss or pelvic pain   Review Of Systems Outlined In HPI  Past Medical History  Diagnosis Date  . Hyperlipidemia   . Hypertension     No past surgical history on file. Family History  Problem Relation Age of Onset  . Alzheimer's disease Mother   . Hyperlipidemia      Social History   Social History  . Marital Status: Married    Spouse Name: N/A  . Number of Children: N/A  . Years of Education: N/A   Occupational History  . Not on file.   Social History Main Topics  . Smoking status: Never Smoker   . Smokeless tobacco: Not on file  . Alcohol Use: 5.0 oz/week    10 drink(s) per week  . Drug Use: No  . Sexual Activity: Yes   Other Topics Concern  . Not on file   Social History Narrative     Objective: BP 159/107 mmHg  Pulse 66  Ht 5\' 10"  (1.778 m)  Wt 206 lb (93.441 kg)  BMI 29.56 kg/m2  General: Alert and Oriented, No Acute Distress HEENT:  Pupils equal, round, reactive to light. Conjunctivae clear.  Moist mucous membranes Lungs: Clear to auscultation bilaterally, no wheezing/ronchi/rales.  Comfortable work of breathing. Good air movement. Cardiac: Regular rate and rhythm. Normal S1/S2.  No murmurs, rubs, nor gallops.   Extremities: No peripheral edema.  Strong peripheral pulses.  Mental Status: No depression, anxiety, nor agitation. Skin: Warm and dry.  Assessment & Plan: William Lane was seen today for hyperlipidemia.  Diagnoses and all orders for this visit:  BPH (benign prostatic hyperplasia) -     PSA  Primary osteoarthritis of right hip  Essential hypertension -     Lipid panel  Elevated PSA -     PSA   BPH: Asymptomatic continue doxazosin daily Right hip osteoarthritis: Offered a referral to sports medicine for consideration of steroid injection however he politelydeclines. Urged him to try taking Celebrex twice a day in hopes of reducing his pain. Essential hypertension: Uncontrolled chronic condition, he is pretty sure that his blood pressure at home is normal, I have encouraged him to test this by buying a blood pressure cuff and record daily blood pressures and drop off in one week. He was given a piece of paper to record this on.   Return in about 3 months (around 09/21/2015) for Blood Pressure.

## 2015-06-21 NOTE — Patient Instructions (Signed)
Please write down daily BP values for one week:                  .

## 2015-06-22 LAB — PSA: PSA: 6.71 ng/mL — ABNORMAL HIGH (ref <=4.00)

## 2015-07-03 ENCOUNTER — Other Ambulatory Visit: Payer: Self-pay | Admitting: Family Medicine

## 2015-07-05 ENCOUNTER — Telehealth: Payer: Self-pay | Admitting: *Deleted

## 2015-07-05 DIAGNOSIS — E559 Vitamin D deficiency, unspecified: Secondary | ICD-10-CM

## 2015-07-05 NOTE — Telephone Encounter (Signed)
Vit d labs place. Received refill request for vit d and labs not checked for this since 2015

## 2015-07-19 ENCOUNTER — Telehealth: Payer: Self-pay | Admitting: Family Medicine

## 2015-07-19 MED ORDER — MELOXICAM 15 MG PO TABS: 15.0000 mg | ORAL_TABLET | Freq: Every day | ORAL | Status: DC

## 2015-07-19 NOTE — Telephone Encounter (Signed)
Will you please let William Lane know that his blood pressures at home are much better than it was in our office.  I think if he continues to try and keep his sodium intake down he'll not need to consider taking any new blood pressure medication.

## 2015-07-19 NOTE — Telephone Encounter (Signed)
I've never seen any difference in the brand name vs generic celebrex so I'd recommend he not try the brand name since it'll be a waste of money. Trying meloxicam 15mg  is perfectly fine, let me know if this provides any benefit and I'll send in a Rx with his name on it.

## 2015-07-19 NOTE — Telephone Encounter (Signed)
He would like a prescription for the meloxicam. He uses [facility] [address].

## 2015-07-19 NOTE — Telephone Encounter (Signed)
Pt stated he is curently on the generic for Celebrex and he states it is not working and doesn't give him any relief. He is wanting to know if he can get Celebrex and not the generic to see if that works. If not he stated his wife was prescibed Mobic for some pain and wants to know if that what be an option for him to try and see if it works. Thanks

## 2015-07-19 NOTE — Telephone Encounter (Signed)
Taken care of

## 2015-07-20 NOTE — Telephone Encounter (Signed)
Notified. 

## 2015-07-22 ENCOUNTER — Other Ambulatory Visit: Payer: Self-pay | Admitting: Family Medicine

## 2015-08-04 ENCOUNTER — Telehealth: Payer: Self-pay

## 2015-08-04 NOTE — Telephone Encounter (Signed)
William HillDonald called and states he was advised to take meloxicam twice daily. His prescription will run out early and he will need an early refill. Please advise.

## 2015-08-05 ENCOUNTER — Ambulatory Visit (INDEPENDENT_AMBULATORY_CARE_PROVIDER_SITE_OTHER): Payer: BLUE CROSS/BLUE SHIELD | Admitting: Family Medicine

## 2015-08-05 ENCOUNTER — Encounter: Payer: Self-pay | Admitting: Family Medicine

## 2015-08-05 VITALS — BP 156/91 | HR 87 | Wt 207.0 lb

## 2015-08-05 DIAGNOSIS — M7071 Other bursitis of hip, right hip: Secondary | ICD-10-CM | POA: Diagnosis not present

## 2015-08-05 DIAGNOSIS — M1611 Unilateral primary osteoarthritis, right hip: Secondary | ICD-10-CM

## 2015-08-05 NOTE — Patient Instructions (Addendum)
Thank you for coming in today. Attend PT.  Do the side leg raises, back leg raises and standing hip rotations 30 reps 2x daily.  Return in 2-4 weeks as needed.   Hip Bursitis Bursitis is a swelling and soreness (inflammation) of a fluid-filled sac (bursa). This sac overlies and protects the joints.  CAUSES   Injury.  Overuse of the muscles surrounding the joint.  Arthritis.  Gout.  Infection.  Cold weather.  Inadequate warm-up and conditioning prior to activities. The cause may not be known.  SYMPTOMS   Mild to severe irritation.  Tenderness and swelling over the outside of the hip.  Pain with motion of the hip.  If the bursa becomes infected, a fever may be present. Redness, tenderness, and warmth will develop over the hip. Symptoms usually lessen in 3 to 4 weeks with treatment, but can come back. TREATMENT If conservative treatment does not work, your caregiver may advise draining the bursa and injecting cortisone into the area. This may speed up the healing process. This may also be used as an initial treatment of choice. HOME CARE INSTRUCTIONS   Apply ice to the affected area for 15-20 minutes every 3 to 4 hours while awake for the first 2 days. Put the ice in a plastic bag and place a towel between the bag of ice and your skin.  Rest the painful joint as much as possible, but continue to put the joint through a normal range of motion at least 4 times per day. When the pain lessens, begin normal, slow movements and usual activities to help prevent stiffness of the hip.  Only take over-the-counter or prescription medicines for pain, discomfort, or fever as directed by your caregiver.  Use crutches to limit weight bearing on the hip joint, if advised.  Elevate your painful hip to reduce swelling. Use pillows for propping and cushioning your legs and hips.  Gentle massage may provide comfort and decrease swelling. SEEK IMMEDIATE MEDICAL CARE IF:   Your pain  increases even during treatment, or you are not improving.  You have a fever.  You have heat and inflammation over the involved bursa.  You have any other questions or concerns. MAKE SURE YOU:   Understand these instructions.  Will watch your condition.  Will get help right away if you are not doing well or get worse.   This information is not intended to replace advice given to you by your health care provider. Make sure you discuss any questions you have with your health care provider.   Document Released: 09/09/2001 Document Revised: 06/12/2011 Document Reviewed: 10/20/2014 Elsevier Interactive Patient Education [employer]

## 2015-08-05 NOTE — Telephone Encounter (Signed)
Should this go to someone else?  I've never seen this patient.  Also looks like it was rx'ed once daily.

## 2015-08-05 NOTE — Progress Notes (Signed)
   Subjective:    I'm seeing this patient as a consultation for:  Dr. Ivan AnchorsHommel  CC: Right hip pain  HPI: patient notes a several month history of right hip pain.The pain is located laterally and is worse with activity and walking and prolonged standing. He denies any radiating pain weakness or numbness fevers or chills. He has occasional groin pain but notes that is not the majority of his pain. He has tried multiple different prescription NSAIDs without much relief.The pain is limiting his ability to play golf which she greatly enjoys.  Past medical history, Surgical history, Family history not pertinant except as noted below, Social history, Allergies, and medications have been entered into the medical record, reviewed, and no changes needed.   Review of Systems: No headache, visual changes, nausea, vomiting, diarrhea, constipation, dizziness, abdominal pain, skin rash, fevers, chills, night sweats, weight loss, swollen lymph nodes, body aches, joint swelling, muscle aches, chest pain, shortness of breath, mood changes, visual or auditory hallucinations.   Objective:    Filed Vitals:   08/05/15 1331  BP: 156/91  Pulse: 87   General: Well Developed, well nourished, and in no acute distress.  Neuro/Psych: Alert and oriented x3, extra-ocular muscles intact, able to move all 4 extremities, sensation grossly intact. Skin: Warm and dry, no rashes noted.  Respiratory: Not using accessory muscles, speaking in full sentences, trachea midline.  Cardiovascular: Pulses palpable, no extremity edema. Abdomen: Does not appear distended. MSK: right hip: Normal-appearing and nontender. Slightly limited internal rotational range of motion without pain. Hip abduction strength is diminished at 4/5. Normal gait.   Review of x-ray report right hip from [facility] showing moderate femoral acetabular DJD.  No results found for this or any previous visit (from the past 24 hour(s)). No results  found.  Impression and Recommendations:   This case required medical decision making of moderate complexity.

## 2015-08-05 NOTE — Telephone Encounter (Signed)
Patient seen in office on 08/05/2015.

## 2015-08-05 NOTE — Assessment & Plan Note (Signed)
Patient has pain in the lateral hip which is consistent with trochanteric bursitis versus insertional tendinitis. He doesn't hurt that much in that area but it certainly seems like the most likely explanation. He does have DJD on x-ray but I don't think this is a big component of it. Plan for a trial of physical therapy and home exercise program. If not better he will return and we will proceed with a trial of diagnostic and hopefully therapeutic intra-articular hip injection.

## 2015-08-10 ENCOUNTER — Encounter: Payer: Self-pay | Admitting: Rehabilitative and Restorative Service Providers"

## 2015-08-10 ENCOUNTER — Ambulatory Visit (INDEPENDENT_AMBULATORY_CARE_PROVIDER_SITE_OTHER): Payer: BLUE CROSS/BLUE SHIELD | Admitting: Rehabilitative and Restorative Service Providers"

## 2015-08-10 DIAGNOSIS — R531 Weakness: Secondary | ICD-10-CM | POA: Diagnosis not present

## 2015-08-10 DIAGNOSIS — R29898 Other symptoms and signs involving the musculoskeletal system: Secondary | ICD-10-CM

## 2015-08-10 DIAGNOSIS — M25551 Pain in right hip: Secondary | ICD-10-CM | POA: Diagnosis not present

## 2015-08-10 DIAGNOSIS — R2689 Other abnormalities of gait and mobility: Secondary | ICD-10-CM

## 2015-08-10 NOTE — Therapy (Signed)
[facility] [address] [place], [place], [address] Phone: [phone]   Fax:  [phone]  Physical Therapy Evaluation  Patient Details  Name: William Lane MRN: [id number] Date of Birth: [birth year] Referring Provider: Dr. Clementeen Graham  Encounter Date: 08/10/2015    Past Medical History  Diagnosis Date  . Hyperlipidemia   . Hypertension     History reviewed. No pertinent past surgical history.  There were no vitals filed for this visit.       Subjective Assessment - 08/10/15 0927    Subjective Patient reports that he retired last year and has played golf several days per week. He has Rt hip pain for ~ 1 year with worsening over the past 3-4 months. He has pain with functional activities.    Pertinent History Arthritis; LBP with DDD/lumbar fractures in high school    How long can you sit comfortably? firm surface - no limit; soft sofa or chair just a few minutes    How long can you stand comfortably? no limit    How long can you walk comfortably? not at all - unlevel surfaces are much worse    Diagnostic tests xrays    Patient Stated Goals get rif of pain and get back to playing golf    Currently in Pain? Yes   Pain Score 5    Pain Location Hip   Pain Orientation Right;Lateral   Pain Descriptors / Indicators Burning;Nagging   Pain Radiating Towards into the groin    Pain Onset More than a month ago   Pain Frequency Intermittent   Aggravating Factors  walking on unlevel surfaces; sitting on soft surfaces; lifting    Pain Relieving Factors lying down; sleeping OK    Effect of Pain on Daily Activities can't play golf             [facility] PT Assessment - 08/10/15 0001    Assessment   Medical Diagnosis Rt hip bursitis   Referring Provider Dr. Clementeen Graham   Onset Date/Surgical Date 08/02/14   Hand Dominance Right   Next MD Visit 6/17    Prior Therapy chiropractic care; medications    Precautions   Precautions None   Restrictions   Weight Bearing Restrictions No   Balance Screen   Has the patient fallen in the past 6 months No   Has the patient had a decrease in activity level because of a fear of falling?  No   Is the patient reluctant to leave their home because of a fear of falling?  No   Home Environment   Additional Comments multilevel home; difficulty with steps    Prior Function   Level of Independence Independent   Vocation Retired   Tax adviser    Leisure golf; yard work; working on a farm; shag dancing every Friday night    Observation/Other Assessments   Focus on Therapeutic Outcomes (FOTO)  60% limitation   Posture/Postural Control   Posture Comments head forward; shoulders rounded and elevated; decreased lumbar lordosis; Rt hemipelvis lower than Lt    AROM   Right/Left Hip --  Lt WFL's; Rt tight end ranges throughout    Lumbar Flexion 70%   Lumbar Extension 55%   Lumbar - Right Side Bend 75%   Lumbar - Left Side Bend 75%   Lumbar - Right Rotation 60%   Lumbar - Left Rotation 60%   Strength   Overall Strength Comments Lt LE 5/5; Rt 5/5  except  hip ext, abd, flex 4+/5    Flexibility   Soft Tissue Assessment /Muscle Length --  tight figure 4 and hip flexors on right    Hamstrings tight Rt at 65 deg; 60 deg    Quadriceps tight bilat    ITB tight Rt    Piriformis tight Rt    Palpation   Spinal mobility WFL's   Palpation comment some tightness through Rt hip    Functional Gait  Assessment   Gait assessed  --  limp on Rt LE with wt bearing on the Rt                    [facility] Adult PT Treatment/Exercise - 08/10/15 0001    Self-Care   Self-Care --  trial of 1/4 in lift Rt shoe    Therapeutic Activites    Therapeutic Activities --  avoid crossing legs/ankle; turning toe out to side    Lumbar Exercises: Stretches   Passive Hamstring Stretch 3 reps;30 seconds   Quad Stretch 3 reps;30 seconds   ITB Stretch 3 reps;30  seconds   Piriformis Stretch 3 reps;30 seconds   Moist Heat Therapy   Number Minutes Moist Heat 15 Minutes   Moist Heat Location Hip   Electrical Stimulation   Electrical Stimulation Location Rt hip    Electrical Stimulation Action IFC   Electrical Stimulation Parameters to tolerance    Electrical Stimulation Goals Pain;Tone                PT Education - 08/10/15 1008    Education provided Yes   Education Details HEP back care    Person(s) Educated Patient   Methods Explanation;Demonstration;Tactile cues;Verbal cues;Handout   Comprehension Verbalized understanding;Returned demonstration;Verbal cues required;Tactile cues required             PT Long Term Goals - 08/10/15 1309    PT LONG TERM GOAL #1   Title Increase mobilty and end range motion Rt hip 09/20/15   Time 6   Period Weeks   Status New   PT LONG TERM GOAL #2   Title 5/5 strength Rt LE 09/20/15   Time 6   Period Weeks   Status New   PT LONG TERM GOAL #3   Title Improve gait pattern with decreased limp Rt LE 09/20/15   Time 6   Period Weeks   Status New   PT LONG TERM GOAL #4   Title Independent in HEP 09/20/15   Time 6   Period Weeks   Status New   PT LONG TERM GOAL #5   Title Improve FOTO to </= 45% limitation 09/20/15   Time 6   Period Weeks   Status New               Plan - 08/10/15 1259    Clinical Impression Statement Shimshon presents with chronic Rt hip pain and limited functional activity associated with Rt hip pain. He has difficulty with walking and golfing. Patient has muscular tightness through the Rt hip flexors, abductors, rotators and hamstrings as well as weakness in the Rt hip. He has an abnormal gait pattern with limp on the Rt LE in weight bearing on the Rt. Patient will benefit form PT to address problems identified.    Rehab Potential Good   PT Frequency 2x / week   PT Duration 6 weeks   PT Treatment/Interventions Patient/family education;ADLs/Self Care Home  Management;Manual techniques;Dry needling;Cryotherapy;Electrical Stimulation;Iontophoresis 4mg /ml Dexamethasone;Moist Heat;Ultrasound;Therapeutic activities;Therapeutic exercise  PT Next Visit Plan progress with LE stretching and  strengthening    PT Home Exercise Plan HEP    Consulted and Agree with Plan of Care Patient      Patient will benefit from skilled therapeutic intervention in order to improve the following deficits and impairments:  Postural dysfunction, Improper body mechanics, Pain, Decreased range of motion, Decreased mobility, Decreased strength, Decreased endurance, Decreased activity tolerance, Increased fascial restricitons  Visit Diagnosis: Pain in right hip - Plan: PT plan of care cert/re-cert  Weakness generalized - Plan: PT plan of care cert/re-cert  Other symptoms and signs involving the musculoskeletal system - Plan: PT plan of care cert/re-cert  Other abnormalities of gait and mobility - Plan: PT plan of care cert/re-cert     Problem List Patient Active Problem List   Diagnosis Date Noted  . Bursitis of right hip 08/05/2015  . Degenerative disc disease, lumbar 03/23/2015  . Osteoarthritis of right hip 03/15/2015  . History of colonic polyps 03/09/2015  . Vitamin D deficiency 08/06/2014  . Hyperlipidemia 01/12/2014  . Essential hypertension 01/12/2014  . Elevated PSA 01/12/2014  . BPH (benign prostatic hyperplasia) 01/12/2014    Keiron Iodice Rober MinionP Falon Huesca PT, MPH  08/10/2015, 1:15 PM  [facility] [address] [place], [place], [address] Phone: [phone]   Fax:  [phone]  Name: Horatio PelDonald S Lipford MRN: [id number] Date of Birth: Mar 18, 1951

## 2015-08-10 NOTE — Patient Instructions (Signed)
HIP: Hamstrings - Supine   Place strap around foot. Raise leg up, keeping knee straight.  Bend opposite knee to protect back if indicated. Hold 30 seconds. 3 reps per set, 2-3 sets per day     Outer Hip Stretch: Reclined IT Band Stretch (Strap)   Strap around one foot, pull leg across body until you feel a pull or stretch, with shoulders on mat. Hold for 30 seconds. Repeat 3 times each leg. 2-3 times/day.  Piriformis Stretch   Lying on back, pull right knee toward opposite shoulder. Hold 30 seconds. Repeat 3 times. Do 2-3 sessions per day. Stretch at various angles   Quads / HF, Supine   Lie near edge of bed, pull both knees up toward chest. Hold one knee as you drop the other leg off the edge of the bed.  Relax hanging knee/can bend knee back if indicated. Hold 30 seconds. Repeat 3 times per session. Do 2-3 sessions per day.    Quads / HF, Prone   Lie face down. Grasp one ankle with same-side hand. Use towel if needed to reach. Gently pull foot toward buttock.  Hold 30 seconds. Repeat 3 times per session. Do 2-3 sessions per day.   Sleeping on Back  Place pillow under knees. A pillow with cervical support and a roll around waist are also helpful. Copyright  VHI. All rights reserved.  Sleeping on Side Place pillow between knees. Use cervical support under neck and a roll around waist as needed. Copyright  VHI. All rights reserved.   Sleeping on Stomach   If this is the only desirable sleeping position, place pillow under lower legs, and under stomach or chest as needed.  Posture - Sitting   Sit upright, head facing forward. Try using a roll to support lower back. Keep shoulders relaxed, and avoid rounded back. Keep hips level with knees. Avoid crossing legs for long periods. Stand to Sit / Sit to Stand   To sit: Bend knees to lower self onto front edge of chair, then scoot back on seat. To stand: Reverse sequence by placing one foot forward, and scoot to  front of seat. Use rocking motion to stand up.   Work Height and Reach  Ideal work height is no more than 2 to 4 inches below elbow level when standing, and at elbow level when sitting. Reaching should be limited to arm's length, with elbows slightly bent.  Bending  Bend at hips and knees, not back. Keep feet shoulder-width apart.    Posture - Standing   Good posture is important. Avoid slouching and forward head thrust. Maintain curve in low back and align ears over shoul- ders, hips over ankles.  Alternating Positions   Alternate tasks and change positions frequently to reduce fatigue and muscle tension. Take rest breaks. Computer Work   Position work to Art gallery managerface forward. Use proper work and seat height. Keep shoulders back and down, wrists straight, and elbows at right angles. Use chair that provides full back support. Add footrest and lumbar roll as needed.  Getting Into / Out of Car  Lower self onto seat, scoot back, then bring in one leg at a time. Reverse sequence to get out.  Dressing  Lie on back to pull socks or slacks over feet, or sit and bend leg while keeping back straight.    Housework - Sink  Place one foot on ledge of cabinet under sink when standing at sink for prolonged periods.   Pushing / Pulling  Pushing is preferable to pulling. Keep back in proper alignment, and use leg muscles to do the work.  Deep Squat   Squat and lift with both arms held against upper trunk. Tighten stomach muscles without holding breath. Use smooth movements to avoid jerking.  Avoid Twisting   Avoid twisting or bending back. Pivot around using foot movements, and bend at knees if needed when reaching for articles.  Carrying Luggage   Distribute weight evenly on both sides. Use a cart whenever possible. Do not twist trunk. Move body as a unit.   Lifting Principles .Maintain proper posture and head alignment. .Slide object as close as possible before lifting. .Move  obstacles out of the way. .Test before lifting; ask for help if too heavy. .Tighten stomach muscles without holding breath. .Use smooth movements; do not jerk. .Use legs to do the work, and pivot with feet. .Distribute the work load symmetrically and close to the center of trunk. .Push instead of pull whenever possible.   Ask For Help   Ask for help and delegate to others when possible. Coordinate your movements when lifting together, and maintain the low back curve.  Log Roll   Lying on back, bend left knee and place left arm across chest. Roll all in one movement to the right. Reverse to roll to the left. Always move as one unit. Housework - Sweeping  Use long-handled equipment to avoid stooping.   Housework - Wiping  Position yourself as close as possible to reach work surface. Avoid straining your back.  Laundry - Unloading Wash   To unload small items at bottom of washer, lift leg opposite to arm being used to reach.  Gardening - Raking  Move close to area to be raked. Use arm movements to do the work. Keep back straight and avoid twisting.     Cart  When reaching into cart with one arm, lift opposite leg to keep back straight.   Getting Into / Out of Bed  Lower self to lie down on one side by raising legs and lowering head at the same time. Use arms to assist moving without twisting. Bend both knees to roll onto back if desired. To sit up, start from lying on side, and use same move-ments in reverse. Housework - Vacuuming  Hold the vacuum with arm held at side. Step back and forth to move it, keeping head up. Avoid twisting.   Laundry - Armed forces training and education officer so that bending and twisting can be avoided.   Laundry - Unloading Dryer  Squat down to reach into clothes dryer or use a reacher.  Gardening - Weeding / Psychiatric nurse or Kneel. Knee pads may be helpful.

## 2015-08-13 ENCOUNTER — Encounter: Payer: Self-pay | Admitting: Rehabilitative and Restorative Service Providers"

## 2015-08-13 ENCOUNTER — Ambulatory Visit (INDEPENDENT_AMBULATORY_CARE_PROVIDER_SITE_OTHER): Payer: BLUE CROSS/BLUE SHIELD | Admitting: Rehabilitative and Restorative Service Providers"

## 2015-08-13 DIAGNOSIS — M25551 Pain in right hip: Secondary | ICD-10-CM | POA: Diagnosis not present

## 2015-08-13 DIAGNOSIS — R29898 Other symptoms and signs involving the musculoskeletal system: Secondary | ICD-10-CM | POA: Diagnosis not present

## 2015-08-13 DIAGNOSIS — R531 Weakness: Secondary | ICD-10-CM

## 2015-08-13 DIAGNOSIS — R2689 Other abnormalities of gait and mobility: Secondary | ICD-10-CM

## 2015-08-13 NOTE — Therapy (Signed)
[facility] [place] [address] [place], [place], [address] Phone: [phone]   Fax:  [phone]  Physical Therapy Treatment  Patient Details  Name: William Lane MRN: [id number] Date of Birth: 1951-01-20 Referring Provider: Dr. Clementeen Graham  Encounter Date: 08/13/2015      PT End of Session - 08/13/15 1335    Visit Number 1   Number of Visits 12   Date for PT Re-Evaluation 09/21/15   PT Start Time 0930   PT Stop Time 1024   PT Time Calculation (min) 54 min   Activity Tolerance Patient tolerated treatment well      Past Medical History  Diagnosis Date  . Hyperlipidemia   . Hypertension     History reviewed. No pertinent past surgical history.  There were no vitals filed for this visit.      Subjective Assessment - 08/13/15 1338    Subjective Patient reports that he has been doing the stretches - OK earlier in the week it was OK. Yesterday he had a bad day- pain in the Rt hip while playing golf. Had to stop. Has had pressure and increased popping in the Hip - in the joint.  Sleeps flat on his stomach. No rotation of the hip and legs are straight. He has a thin pillow under his chest and arms are overhead. Has slept this way since 65 yo    Currently in Pain? Yes   Pain Score 7    Pain Location Hip   Pain Orientation Right;Lateral  deep in the joint    Pain Descriptors / Indicators Burning;Nagging   Pain Type Chronic pain   Pain Radiating Towards into the groin    Pain Onset More than a month ago   Pain Frequency Intermittent                         [facility] Adult PT Treatment/Exercise - 08/13/15 0001    Lumbar Exercises: Stretches   Passive Hamstring Stretch 3 reps;30 seconds  popping through the hip with each stretch    Quad Stretch 3 reps;30 seconds   ITB Stretch 3 reps;30 seconds   Piriformis Stretch 3 reps;30 seconds   Knee/Hip Exercises: Stretches   Active Hamstring Stretch Right;Left;3  reps;30 seconds  sitting with heel resting on floor    Knee/Hip Exercises: Sidelying   Hip ABduction Right;20 reps   Clams 10 X 2    Knee/Hip Exercises: Prone   Hip Extension Left;Right;10 reps  glut set 10 sec x 10 reps    Moist Heat Therapy   Number Minutes Moist Heat 15 Minutes   Moist Heat Location Hip   Electrical Stimulation   Electrical Stimulation Location Rt hip    Electrical Stimulation Action IFC   Electrical Stimulation Parameters to tolerance   Electrical Stimulation Goals Pain;Tone                PT Education - 08/13/15 1405    Education provided Yes   Education Details HEP    Person(s) Educated Patient   Methods Explanation;Demonstration;Tactile cues;Verbal cues;Handout   Comprehension Verbalized understanding;Returned demonstration;Verbal cues required;Tactile cues required             PT Long Term Goals - 08/13/15 1341    PT LONG TERM GOAL #1   Title Increase mobilty and end range motion Rt hip 09/20/15   Time 6   Period Weeks   Status On-going   PT LONG TERM  GOAL #2   Title 5/5 strength Rt LE 09/20/15   Time 6   Period Weeks   Status On-going   PT LONG TERM GOAL #3   Title Improve gait pattern with decreased limp Rt LE 09/20/15   Time 6   Period Weeks   Status On-going   PT LONG TERM GOAL #4   Title Independent in HEP 09/20/15   Time 6   Period Weeks   Status On-going   PT LONG TERM GOAL #5   Title Improve FOTO to </= 45% limitation 09/20/15   Time 6   Period Weeks   Status On-going               Plan - 08/13/15 1445    Clinical Impression Statement Increased pain in hip with significant increase in popping in the hip as well. Popping with HS stretch. Increased difficuty with golfing and walking on the golf course. Responded well to modificatioin of exercises today.    Rehab Potential Good   PT Frequency 2x / week   PT Duration 6 weeks   PT Treatment/Interventions Patient/family education;ADLs/Self Care Home  Management;Manual techniques;Dry needling;Cryotherapy;Electrical Stimulation;Iontophoresis 4mg /ml Dexamethasone;Moist Heat;Ultrasound;Therapeutic activities;Therapeutic exercise   PT Next Visit Plan progress with LE stretching and  strengthening - assess response to exercses    PT Home Exercise Plan HEP    Consulted and Agree with Plan of Care Patient      Patient will benefit from skilled therapeutic intervention in order to improve the following deficits and impairments:  Postural dysfunction, Improper body mechanics, Pain, Decreased range of motion, Decreased mobility, Decreased strength, Decreased endurance, Decreased activity tolerance, Increased fascial restricitons  Visit Diagnosis: Pain in right hip  Weakness generalized  Other symptoms and signs involving the musculoskeletal system  Other abnormalities of gait and mobility     Problem List Patient Active Problem List   Diagnosis Date Noted  . Bursitis of right hip 08/05/2015  . Degenerative disc disease, lumbar 03/23/2015  . Osteoarthritis of right hip 03/15/2015  . History of colonic polyps 03/09/2015  . Vitamin D deficiency 08/06/2014  . Hyperlipidemia 01/12/2014  . Essential hypertension 01/12/2014  . Elevated PSA 01/12/2014  . BPH (benign prostatic hyperplasia) 01/12/2014    Megham Dwyer Rober MinionP Timon Geissinger PT, MPH  08/13/2015, 2:48 PM  [facility] [address] [place], [place], [address] Phone: [phone]   Fax:  [phone]  Name: William Lane MRN: [id number] Date of Birth: Aug 27, 1950

## 2015-08-13 NOTE — Patient Instructions (Signed)
Lying on stomach  Tighten buttocks. Hold for __10_ seconds. Relax for __1_ seconds. Repeat _10__ times. Do _1-2__ times a day.    Strengthening: Hip Extension (Prone)    Tighten muscles on front of left thigh, then lift leg __6-8_ inches from surface, keeping knee locked. Repeat _10___ times per set. Do _1-2_ sets per session. Do __1-2__ sessions per day.   Strengthening: Hip Abduction (Side-Lying)    Tighten muscles on front of left thigh, then lift leg __10-12__ inches from surface, keeping knee locked.  Repeat __10__ times per set. Do __1-2__ sets per session. Do __1-2__ sessions per day.    Strengthening: Hip Abductor - Resisted    Let knees drop out and pull back in no band  Repeat __10__ times per set. Do __1-2__ sessions per day.   Self-Mobilization: Knee rotation (Prone)    Bring foot in and out keeping knee bent. Windshield wiper.  Repeat __5-10__ times per set. Do _1-2___ sets per session. Do __1-2__ sessions per day.    Stretching: Hamstring (Sitting for now)   Sitting not standing  Place right foot on stool. Slowly lean forward, keeping back straight, until stretch is felt in back of thigh. Hold __30__ seconds. Repeat __3__ times per set.  Do _2-3___ sessions per day.

## 2015-08-16 ENCOUNTER — Ambulatory Visit (INDEPENDENT_AMBULATORY_CARE_PROVIDER_SITE_OTHER): Payer: BLUE CROSS/BLUE SHIELD | Admitting: Rehabilitative and Restorative Service Providers"

## 2015-08-16 ENCOUNTER — Encounter: Payer: Self-pay | Admitting: Rehabilitative and Restorative Service Providers"

## 2015-08-16 DIAGNOSIS — R531 Weakness: Secondary | ICD-10-CM

## 2015-08-16 DIAGNOSIS — R2689 Other abnormalities of gait and mobility: Secondary | ICD-10-CM

## 2015-08-16 DIAGNOSIS — R29898 Other symptoms and signs involving the musculoskeletal system: Secondary | ICD-10-CM

## 2015-08-16 DIAGNOSIS — M25551 Pain in right hip: Secondary | ICD-10-CM

## 2015-08-16 NOTE — Therapy (Signed)
[facility] [address] [place], [place], [address] Phone: [phone]   Fax:  [phone]  Physical Therapy Treatment  Patient Details  Name: Horatio PelDonald S Poland MRN: [id number] Date of Birth: 1950-05-17 Referring Provider: Dr. Clementeen GrahamEvan Corey  Encounter Date: 08/16/2015      PT End of Session - 08/16/15 0741    Visit Number 3   Number of Visits 12   Date for PT Re-Evaluation 09/21/15   PT Start Time 0736   PT Stop Time 0829   PT Time Calculation (min) 53 min   Activity Tolerance Patient tolerated treatment well      Past Medical History  Diagnosis Date  . Hyperlipidemia   . Hypertension     History reviewed. No pertinent past surgical history.  There were no vitals filed for this visit.      Subjective Assessment - 08/16/15 0741    Subjective Pt reports he hasn't seen much difference last treatment. Hip still is making popping sound.    Currently in Pain? Yes   Pain Score 7    Pain Location Hip   Pain Orientation Right   Pain Descriptors / Indicators Nagging   Aggravating Factors  walking on unlevel surfaces, sitting on soft surfaces, lifting.    Pain Relieving Factors lying down                         [facility] Adult PT Treatment/Exercise - 08/16/15 0001    Lumbar Exercises: Stretches   Passive Hamstring Stretch 3 reps;30 seconds  popping through the hip with each stretch    Piriformis Stretch 3 reps;30 seconds   Knee/Hip Exercises: Stretches   Active Hamstring Stretch Right;Left;3 reps;30 seconds  sitting with heel resting on floor    Passive Hamstring Stretch Right;2 reps;30 seconds;Left   Other Knee/Hip Stretches ITB stretch x 30 sec x 2 reps    Knee/Hip Exercises: Aerobic   Nustep L4: 5 min    Knee/Hip Exercises: Prone   Hip Extension Left;Right;10 reps  glut set 10 sec x 10 reps    Moist Heat Therapy   Number Minutes Moist Heat 15 Minutes   Moist Heat Location Hip   Electrical  Stimulation   Electrical Stimulation Location Rt hip    Electrical Stimulation Action IFC   Electrical Stimulation Parameters to tolerance   Electrical Stimulation Goals Pain;Tone   Manual Therapy   Joint Mobilization PA glides through hip joint wit hpt prone    Soft tissue mobilization working through the hip musculature inc gluts and piriformis pt prone    Manual Traction manual traction through long axis arm LE with straight traction in various angles and oscillation                 PT Education - 08/16/15 1311    Education provided Yes   Education Details cont HEP as tolerated - avoiding "popping" as possible   Person(s) Educated Patient   Methods Explanation   Comprehension Verbalized understanding             PT Long Term Goals - 08/13/15 1341    PT LONG TERM GOAL #1   Title Increase mobilty and end range motion Rt hip 09/20/15   Time 6   Period Weeks   Status On-going   PT LONG TERM GOAL #2   Title 5/5 strength Rt LE 09/20/15   Time 6   Period Weeks   Status On-going  PT LONG TERM GOAL #3   Title Improve gait pattern with decreased limp Rt LE 09/20/15   Time 6   Period Weeks   Status On-going   PT LONG TERM GOAL #4   Title Independent in HEP 09/20/15   Time 6   Period Weeks   Status On-going   PT LONG TERM GOAL #5   Title Improve FOTO to </= 45% limitation 09/20/15   Time 6   Period Weeks   Status On-going               Plan - 08/16/15 1317    Clinical Impression Statement persistent pain and popping in the hip. We will continue efforts in therapy and through HEP - but recommend that Gilman return to MD for further evaluation if symptoms continue with no response to intervention.    Rehab Potential Good   PT Frequency 2x / week   PT Duration 6 weeks   PT Treatment/Interventions Patient/family education;ADLs/Self Care Home Management;Manual techniques;Dry needling;Cryotherapy;Electrical Stimulation;Iontophoresis /ml Dexamethasone;Moist  Heat;Ultrasound;Therapeutic activities;Therapeutic exercise   PT Next Visit Plan progress with LE stretching and  strengthening - assess response to exercses try standing swing LE off step for some traction; standing hip ext/abd - trial of ball release supine with ball at posterior hip joint; trial of [place] to any tight musculature around hip/greater trochanter   PT Home Exercise Plan HEP    Consulted and Agree with Plan of Care Patient      Patient will benefit from skilled therapeutic intervention in order to improve the following deficits and impairments:  Postural dysfunction, Improper body mechanics, Pain, Decreased range of motion, Decreased mobility, Decreased strength, Decreased endurance, Decreased activity tolerance, Increased fascial restricitons  Visit Diagnosis: Pain in right hip  Weakness generalized  Other symptoms and signs involving the musculoskeletal system  Other abnormalities of gait and mobility     Problem List Patient Active Problem List   Diagnosis Date Noted  . Bursitis of right hip 08/05/2015  . Degenerative disc disease, lumbar 03/23/2015  . Osteoarthritis of right hip 03/15/2015  . History of colonic polyps 03/09/2015  . Vitamin D deficiency 08/06/2014  . Hyperlipidemia 01/12/2014  . Essential hypertension 01/12/2014  . Elevated PSA 01/12/2014  . BPH (benign prostatic hyperplasia) 01/12/2014    Comfort Iversen Rober Minion PT, MPH  08/16/2015, 1:22 PM  [facility] [address] [place], [place], [address] Phone: [phone]   Fax:  [phone]  Name: JAQUARI RECKNER MRN: [id number] Date of Birth: October 10, 1950

## 2015-08-18 ENCOUNTER — Encounter: Payer: BLUE CROSS/BLUE SHIELD | Admitting: Physical Therapy

## 2015-08-19 ENCOUNTER — Ambulatory Visit (INDEPENDENT_AMBULATORY_CARE_PROVIDER_SITE_OTHER): Payer: BLUE CROSS/BLUE SHIELD | Admitting: Family Medicine

## 2015-08-19 ENCOUNTER — Encounter: Payer: BLUE CROSS/BLUE SHIELD | Admitting: Rehabilitative and Restorative Service Providers"

## 2015-08-19 ENCOUNTER — Ambulatory Visit (INDEPENDENT_AMBULATORY_CARE_PROVIDER_SITE_OTHER): Payer: BLUE CROSS/BLUE SHIELD

## 2015-08-19 ENCOUNTER — Ambulatory Visit (INDEPENDENT_AMBULATORY_CARE_PROVIDER_SITE_OTHER): Payer: BLUE CROSS/BLUE SHIELD | Admitting: Physical Therapy

## 2015-08-19 ENCOUNTER — Encounter: Payer: Self-pay | Admitting: Family Medicine

## 2015-08-19 VITALS — BP 117/101 | HR 76 | Wt 195.0 lb

## 2015-08-19 DIAGNOSIS — M7071 Other bursitis of hip, right hip: Secondary | ICD-10-CM | POA: Diagnosis not present

## 2015-08-19 DIAGNOSIS — M25551 Pain in right hip: Secondary | ICD-10-CM

## 2015-08-19 DIAGNOSIS — R531 Weakness: Secondary | ICD-10-CM | POA: Diagnosis not present

## 2015-08-19 DIAGNOSIS — R2689 Other abnormalities of gait and mobility: Secondary | ICD-10-CM

## 2015-08-19 DIAGNOSIS — R29898 Other symptoms and signs involving the musculoskeletal system: Secondary | ICD-10-CM | POA: Diagnosis not present

## 2015-08-19 DIAGNOSIS — M1611 Unilateral primary osteoarthritis, right hip: Secondary | ICD-10-CM

## 2015-08-19 NOTE — Therapy (Signed)
[facility] [place] [address] [place], [place], [address] Phone: [phone]   Fax:  [phone]  Physical Therapy Treatment  Patient Details  Name: William Lane MRN: [id number] Date of Birth: July 06, 1950 Referring Provider: Dr. Denyse Amass  Encounter Date: 08/19/2015      PT End of Session - 08/19/15 1531    Visit Number 4   Number of Visits 12   Date for PT Re-Evaluation 09/21/15   PT Start Time 1540   PT Stop Time 1610   PT Time Calculation (min) 30 min   Activity Tolerance Patient tolerated treatment well      Past Medical History  Diagnosis Date  . Hyperlipidemia   . Hypertension     No past surgical history on file.  There were no vitals filed for this visit.      Subjective Assessment - 08/19/15 1602    Subjective Pt had injection in Rt hip today; requests MHP and estim for pain control. "Dr told me to take it easy for the next 2 days"   Currently in Pain? Yes   Pain Score 4    Pain Location Hip   Pain Orientation Right            [facility] PT Assessment - 08/19/15 0001    Assessment   Medical Diagnosis Rt hip bursitis   Referring Provider Dr. Denyse Amass   Onset Date/Surgical Date 08/02/14   Hand Dominance Right   Next MD Visit 6/17            [facility] Adult PT Treatment/Exercise - 08/19/15 0001    Modalities   Modalities Electrical Stimulation;Moist Heat;Ultrasound   Moist Heat Therapy   Number Minutes Moist Heat 20 Minutes   Moist Heat Location Hip   Electrical Stimulation   Electrical Stimulation Location Rt hip    Electrical Stimulation Action IFC   Electrical Stimulation Parameters to tolerance   Electrical Stimulation Goals Pain;Tone   Ultrasound   Ultrasound Location musculature surrounding greater trochanter   Ultrasound Parameters 100%, 1.3 w/cm2 x 8 min   Ultrasound Goals Pain           PT Long Term Goals - 08/13/15 1341    PT LONG TERM GOAL #1   Title Increase mobilty and end  range motion Rt hip 09/20/15   Time 6   Period Weeks   Status On-going   PT LONG TERM GOAL #2   Title 5/5 strength Rt LE 09/20/15   Time 6   Period Weeks   Status On-going   PT LONG TERM GOAL #3   Title Improve gait pattern with decreased limp Rt LE 09/20/15   Time 6   Period Weeks   Status On-going   PT LONG TERM GOAL #4   Title Independent in HEP 09/20/15   Time 6   Period Weeks   Status On-going   PT LONG TERM GOAL #5   Title Improve FOTO to </= 45% limitation 09/20/15   Time 6   Period Weeks   Status On-going               Plan - 08/19/15 1605    Clinical Impression Statement Ther ex held per pt request.  Pt tolerated estim/MHP and ultrasound well, reporting slight decrease in pain at end of session.     Rehab Potential Good   PT Frequency 2x / week   PT Duration 6 weeks   PT Treatment/Interventions Patient/family education;ADLs/Self Care Home Management;Manual techniques;Dry needling;Cryotherapy;Lobbyist  Stimulation;Iontophoresis 4mg /ml Dexamethasone;Moist Heat;Ultrasound;Therapeutic activities;Therapeutic exercise   PT Next Visit Plan Will begin strengthening for RLE.    Consulted and Agree with Plan of Care Patient      Patient will benefit from skilled therapeutic intervention in order to improve the following deficits and impairments:  Postural dysfunction, Improper body mechanics, Pain, Decreased range of motion, Decreased mobility, Decreased strength, Decreased endurance, Decreased activity tolerance, Increased fascial restricitons  Visit Diagnosis: Pain in right hip  Weakness generalized  Other symptoms and signs involving the musculoskeletal system  Other abnormalities of gait and mobility     Problem List Patient Active Problem List   Diagnosis Date Noted  . Bursitis of right hip 08/05/2015  . Degenerative disc disease, lumbar 03/23/2015  . Osteoarthritis of right hip 03/15/2015  . History of colonic polyps 03/09/2015  . Vitamin D  deficiency 08/06/2014  . Hyperlipidemia 01/12/2014  . Essential hypertension 01/12/2014  . Elevated PSA 01/12/2014  . BPH (benign prostatic hyperplasia) 01/12/2014   Mayer CamelJennifer Carlson-Long, PTA 08/19/2015 4:12 PM  [facility] [address] [place], [place], [address] Phone: [phone]   Fax:  [phone]  Name: Horatio PelDonald S Smouse MRN: [id number] Date of Birth: [birth year]

## 2015-08-19 NOTE — Progress Notes (Signed)
William Lane is a 65 y.o. male who presents to [facility] Primary Care today for right hip pain. Patient was seen about 2 weeks ago for right lateral hip pain thought to be related to trochanteric bursitis. He's had a trial of physical therapy which has helped a little. However he was playing golf and developed significant pain in the lateral hip associated with popping. His pain is worsened. He notes groin pain and lateral and posterior buttocks and hip pain. No weakness or numbness or loss of function.   Past Medical History  Diagnosis Date  . Hyperlipidemia   . Hypertension    No past surgical history on file. Social History  Substance Use Topics  . Smoking status: Never Smoker   . Smokeless tobacco: Not on file  . Alcohol Use: 5.0 oz/week    10 drink(s) per week   family history includes Alzheimer's disease in his mother.  ROS as above Medications: Current Outpatient Prescriptions  Medication Sig Dispense Refill  . aspirin 81 MG tablet Take 81 mg by mouth daily.    Marland Kitchen atorvastatin (LIPITOR) 40 MG tablet TAKE 1 TABLET (40 MG TOTAL) BY MOUTH DAILY. 90 tablet 1  . celecoxib (CELEBREX) 200 MG capsule Take 1 capsule (200 mg total) by mouth 2 (two) times daily as needed for moderate pain. (Meloxicam ineffective) 60 capsule 5  . doxazosin (CARDURA) 8 MG tablet Take 1 tablet (8 mg total) by mouth daily. 90 tablet 1  . meloxicam (MOBIC) 15 MG tablet Take 1 tablet (15 mg total) by mouth daily. 30 tablet 5  . niacin (NIASPAN) 1000 MG CR tablet Take 1 tablet (1,000 mg total) by mouth at bedtime. 90 tablet 1  . Vitamin D, Ergocalciferol, (DRISDOL) 50000 units CAPS capsule Needs labs checked. Take one capsule by mouth every 7 days 12 capsule 0   No current facility-administered medications for this visit.   No Known Allergies   Exam:  BP 117/101 mmHg  Pulse 76  Wt 195 lb (88.451 kg) Gen:  Well NAD Right hip: Normal-appearing Mildly tender to palpation lateral and posterior aspects of the trochanter. Range of motion is limited and externally and externally both actively and passively. Hip abduction strength is diminished at 4/5. Antalgic gait.  X-ray right hip: Severe DJD throughout right hip with bone spurs both superior and inferiorly. Possible detached bone spur superior portion. There appears to be a cam deformity as well. Awaiting formal radiology review  Procedure: Real-time Ultrasound Guided diagnostic and therapeutic Injection of right hip  Device: GE Logiq E  Images permanently stored and available for review in the ultrasound unit. Verbal informed consent obtained. Discussed risks and benefits of procedure. Warned about infection bleeding damage to structures skin hypopigmentation and fat atrophy among others. Patient expresses understanding and agreement Time-out conducted.  Noted no overlying erythema, induration, or other signs of local infection.  Skin prepped in a sterile fashion.  Local anesthesia: Topical Ethyl chloride.  With sterile technique and under real time ultrasound guidance: 40 mg of Kenalog and 4 mL of Marcaine injected easily.  Completed without difficulty  Pain immediately resolved suggesting accurate placement of the medication.  Advised to call if fevers/chills, erythema, induration, drainage, or persistent bleeding.  Images permanently stored and available for review in the ultrasound unit.  Impression: Technically successful ultrasound guided injection.    No results found for this or any previous visit (from the past 24 hour(s)). No results found.  65 year old male with right hip pain. History is somewhat confusing. He experiences the majority of this pain on the lateral aspect of his hip. However his pain dramatically improved with injection of Marcaine and Kenalog into the hip capsule under ultrasound guidance. I believe  that most of his pain is probably due to femoral acetabular DJD with a component of trochanteric bursitis. Continue physical therapy for trochanteric bursitis. Popping without pain is acceptable. The patient perceives only limited benefit would consider a trochanteric injection. However I'm concerned he may need a hip replacement in the near future.

## 2015-08-19 NOTE — Patient Instructions (Signed)
Thank you for coming in today. Return in 2 weeks.  Call or go to the ER if you develop a large red swollen joint with extreme pain or oozing puss.   Continue PT.

## 2015-08-20 NOTE — Progress Notes (Signed)
Quick Note:  Radiology agrees that you have medium to bad arthritis in the hip. No fractures seen. ______

## 2015-08-23 ENCOUNTER — Encounter: Payer: BLUE CROSS/BLUE SHIELD | Admitting: Rehabilitative and Restorative Service Providers"

## 2015-08-25 ENCOUNTER — Encounter: Payer: BLUE CROSS/BLUE SHIELD | Admitting: Rehabilitative and Restorative Service Providers"

## 2015-08-25 ENCOUNTER — Encounter: Payer: Self-pay | Admitting: Rehabilitative and Restorative Service Providers"

## 2015-08-25 ENCOUNTER — Ambulatory Visit (INDEPENDENT_AMBULATORY_CARE_PROVIDER_SITE_OTHER): Payer: BLUE CROSS/BLUE SHIELD | Admitting: Rehabilitative and Restorative Service Providers"

## 2015-08-25 DIAGNOSIS — R531 Weakness: Secondary | ICD-10-CM | POA: Diagnosis not present

## 2015-08-25 DIAGNOSIS — R29898 Other symptoms and signs involving the musculoskeletal system: Secondary | ICD-10-CM | POA: Diagnosis not present

## 2015-08-25 DIAGNOSIS — R2689 Other abnormalities of gait and mobility: Secondary | ICD-10-CM

## 2015-08-25 DIAGNOSIS — M25551 Pain in right hip: Secondary | ICD-10-CM | POA: Diagnosis not present

## 2015-08-25 NOTE — Therapy (Addendum)
[place] [facility] [place], [place], [address] Phone: [phone]   Fax:  [phone]  Physical Therapy Treatment  Patient Details  Name: William Lane MRN: [id number] Date of Birth: 02/15/1951 Referring Provider: Dr. Georgina Snell  Encounter Date: 08/25/2015      PT End of Session - 08/25/15 0755    Visit Number 5   Number of Visits 12   Date for PT Re-Evaluation 09/21/15   PT Start Time 0759   PT Stop Time 0833   PT Time Calculation (min) 34 min   Activity Tolerance Patient tolerated treatment well      Past Medical History  Diagnosis Date  . Hyperlipidemia   . Hypertension     History reviewed. No pertinent past surgical history.  There were no vitals filed for this visit.      Subjective Assessment - 08/25/15 0756    Subjective No change from the injection. Hip is still hurting and popping every time he moves.    Currently in Pain? Yes   Pain Score 8                          [facility] Adult PT Treatment/Exercise - 08/25/15 0001    Knee/Hip Exercises: Supine   Straight Leg Raises Right;Strengthening;10 reps   Knee/Hip Exercises: Sidelying   Hip ABduction Right;20 reps   Clams 10   Knee/Hip Exercises: Prone   Hip Extension Left;Right;10 reps  glut set 10 sec x 10    Modalities   Modalities Electrical Stimulation;Moist Heat   Moist Heat Therapy   Number Minutes Moist Heat 20 Minutes   Moist Heat Location Hip   Electrical Stimulation   Electrical Stimulation Location Rt hip    Electrical Stimulation Action IFC   Electrical Stimulation Parameters to tolerance   Electrical Stimulation Goals Pain;Tone   Manual Therapy   Joint Mobilization PA glides through hip joint wit hpt prone    Soft tissue mobilization working through the hip musculature inc gluts and piriformis pt prone    Manual Traction manual traction through long axis arm LE with straight traction in various angles and  oscillation                      PT Long Term Goals - 08/25/15 0823    PT LONG TERM GOAL #1   Title Increase mobilty and end range motion Rt hip 09/20/15   Time 6   Period Weeks   Status On-going   PT LONG TERM GOAL #2   Title 5/5 strength Rt LE 09/20/15   Time 6   Period Weeks   Status On-going   PT LONG TERM GOAL #3   Title Improve gait pattern with decreased limp Rt LE 09/20/15   Time 6   Period Weeks   Status On-going   PT LONG TERM GOAL #4   Title Independent in HEP 09/20/15   Time 6   Period Weeks   Status On-going   PT LONG TERM GOAL #5   Title Improve FOTO to </= 45% limitation 09/20/15   Time 6   Period Weeks   Status On-going               Plan - 08/25/15 0820    Clinical Impression Statement Continued pain limitiing functional activity, exercise and rest/sleep. Rainn is unable to progress with hip rehab due to symptoms. He has return appt with MD 09/02/15  and will be placed on hold for PT pending MD appt and decision on Rt THA.    Rehab Potential Good   PT Frequency 2x / week   PT Duration 6 weeks   PT Treatment/Interventions Patient/family education;ADLs/Self Care Home Management;Manual techniques;Dry needling;Cryotherapy;Electrical Stimulation;Iontophoresis 10m/ml Dexamethasone;Moist Heat;Ultrasound;Therapeutic activities;Therapeutic exercise   PT Next Visit Plan Patient on hold pending MD appt and decision on Rt THA    PT Home Exercise Plan HEP    Consulted and Agree with Plan of Care Patient      Patient will benefit from skilled therapeutic intervention in order to improve the following deficits and impairments:  Postural dysfunction, Improper body mechanics, Pain, Decreased range of motion, Decreased mobility, Decreased strength, Decreased endurance, Decreased activity tolerance, Increased fascial restricitons  Visit Diagnosis: Pain in right hip  Weakness generalized  Other symptoms and signs involving the musculoskeletal  system  Other abnormalities of gait and mobility     Problem List Patient Active Problem List   Diagnosis Date Noted  . Bursitis of right hip 08/05/2015  . Degenerative disc disease, lumbar 03/23/2015  . Osteoarthritis of right hip 03/15/2015  . History of colonic polyps 03/09/2015  . Vitamin D deficiency 08/06/2014  . Hyperlipidemia 01/12/2014  . Essential hypertension 01/12/2014  . Elevated PSA 01/12/2014  . BPH (benign prostatic hyperplasia) 01/12/2014    Clementina Mareno PNilda SimmerPT, MPH  08/25/2015, 8:24 AM  [facility] [address]Phone: [phone]  Fax:  [phone] Name: DDJANGO NGUYENMRN: [id number] of Birth: [birth year]   PHYSICAL THERAPY DISCHARGE SUMMARY  Visits from Start of Care: 5  Current functional level related to goals / functional outcomes: Patient continued to have hip pain and dysfunction. He was unable to tolerate some of the exercises due to pain. Patient was to return to MD for referral to orthopedist for possible THA.   Remaining deficits: Continued pain and limitations. Unable to progress with therapy.    Education / Equipment: HEP   Plan: Patient agrees to discharge.  Patient goals were not met. Patient is being discharged due to                                                     ?????   Return to MD for further evaluation for possible THA.   Ader Fritze P. HHelene KelpPT, MPH 10/07/2015 11:09 AM

## 2015-08-27 ENCOUNTER — Encounter: Payer: BLUE CROSS/BLUE SHIELD | Admitting: Rehabilitative and Restorative Service Providers"

## 2015-09-01 ENCOUNTER — Encounter: Payer: BLUE CROSS/BLUE SHIELD | Admitting: Rehabilitative and Restorative Service Providers"

## 2015-09-02 ENCOUNTER — Ambulatory Visit (INDEPENDENT_AMBULATORY_CARE_PROVIDER_SITE_OTHER): Payer: BLUE CROSS/BLUE SHIELD | Admitting: Family Medicine

## 2015-09-02 ENCOUNTER — Encounter: Payer: Self-pay | Admitting: Family Medicine

## 2015-09-02 VITALS — BP 135/81 | HR 89 | Wt 189.0 lb

## 2015-09-02 DIAGNOSIS — M1611 Unilateral primary osteoarthritis, right hip: Secondary | ICD-10-CM

## 2015-09-02 NOTE — Patient Instructions (Signed)
Thank you for coming in today. I think it is time to see an orthopedic surgeon.  [place] [facility] [place]  You are welcome to return to our PT office for post surgical PT.  You can ask Dr Pill to write a generic PT order for our PT to follow.

## 2015-09-02 NOTE — Progress Notes (Signed)
       William Lane is a 65 y.o. male [facility] today for follow-up right hip pain. Patient notes continued severe right hip pain. He has tried physical therapy which has not helped. Additionally he had a ultrasound-guided diagnostic and therapeutic femoral acetabular injection 2 weeks ago which didn't really help much at all. He notes slight improvement immediately after the injection.   Past Medical History  Diagnosis Date  . Hyperlipidemia   . Hypertension    No past surgical history on file. Social History  Substance Use Topics  . Smoking status: Never Smoker   . Smokeless tobacco: Not on file  . Alcohol Use: 5.0 oz/week    10 drink(s) per week   family history includes Alzheimer's disease in his mother.  ROS as above:  Medications: Current Outpatient Prescriptions  Medication Sig Dispense Refill  . aspirin 81 MG tablet Take 81 mg by mouth daily.    Marland Kitchen. atorvastatin (LIPITOR) 40 MG tablet TAKE 1 TABLET (40 MG TOTAL) BY MOUTH DAILY. 90 tablet 1  . celecoxib (CELEBREX) 200 MG capsule Take 1 capsule (200 mg total) by mouth 2 (two) times daily as needed for moderate pain. (Meloxicam ineffective) 60 capsule 5  . doxazosin (CARDURA) 8 MG tablet Take 1 tablet (8 mg total) by mouth daily. 90 tablet 1  . meloxicam (MOBIC) 15 MG tablet Take 1 tablet (15 mg total) by mouth daily. 30 tablet 5  . niacin (NIASPAN) 1000 MG CR tablet Take 1 tablet (1,000 mg total) by mouth at bedtime. 90 tablet 1  . Vitamin D, Ergocalciferol, (DRISDOL) 50000 units CAPS capsule Needs labs checked. Take one capsule by mouth every 7 days 12 capsule 0   No current facility-administered medications for this visit.   No Known Allergies   Exam:  BP 135/81 mmHg  Pulse 89  Wt 189 lb (85.73 kg) Gen: Well NAD Right hip: Nontender. Decreased motion due to pain. Normal gait.  No  results found for this or any previous visit (from the past 24 hour(s)). No results found.    Assessment and Plan: 65 y.o. male with right hip pain almost certainly due to DJD. Refer to orthopedic surgery for evaluation and possible arthroplasty.  Discussed warning signs or symptoms. Please see discharge instructions. Patient expresses understanding.

## 2015-09-03 ENCOUNTER — Encounter: Payer: BLUE CROSS/BLUE SHIELD | Admitting: Rehabilitative and Restorative Service Providers"

## 2015-09-21 ENCOUNTER — Ambulatory Visit (INDEPENDENT_AMBULATORY_CARE_PROVIDER_SITE_OTHER): Payer: BLUE CROSS/BLUE SHIELD | Admitting: Family Medicine

## 2015-09-21 ENCOUNTER — Encounter: Payer: Self-pay | Admitting: Family Medicine

## 2015-09-21 VITALS — BP 145/99 | HR 46 | Wt 184.0 lb

## 2015-09-21 DIAGNOSIS — F411 Generalized anxiety disorder: Secondary | ICD-10-CM | POA: Diagnosis not present

## 2015-09-21 DIAGNOSIS — I10 Essential (primary) hypertension: Secondary | ICD-10-CM

## 2015-09-21 MED ORDER — ESCITALOPRAM OXALATE 10 MG PO TABS: 10.0000 mg | ORAL_TABLET | Freq: Every day | ORAL | Status: DC

## 2015-09-21 NOTE — Progress Notes (Signed)
CC: William Lane is a 65 y.o. male is here for Hypertension   Subjective: HPI:  Essential hypertension: No outside blood pressures to report. He is taking doxazosin on a daily basis. He denies chest pain shortness of breath orthopnea nor peripheral edema. He tells me that he believes his blood pressure is elevated due to recent stress that he's been under. He's been unable to golf to release stress and his children are becoming more financial burden on him. He tells me he is happy with life other than the above issues and anxiousness caused by a upcoming right hip replacement. Symptoms are present on a daily basis to a moderate degree and interfering with quality of life. He denies any depression or sleep disturbance. He wants something that he can take on a daily basis to help reduce stress. Denies chest pain shortness of breath   Review Of Systems Outlined In HPI  Past Medical History  Diagnosis Date  . Hyperlipidemia   . Hypertension     No past surgical history on file. Family History  Problem Relation Age of Onset  . Alzheimer's disease Mother   . Hyperlipidemia      Social History   Social History  . Marital Status: Married    Spouse Name: N/A  . Number of Children: N/A  . Years of Education: N/A   Occupational History  . Not on file.   Social History Main Topics  . Smoking status: Never Smoker   . Smokeless tobacco: Not on file  . Alcohol Use: 5.0 oz/week    10 drink(s) per week  . Drug Use: No  . Sexual Activity: Yes   Other Topics Concern  . Not on file   Social History Narrative     Objective: BP 145/99 mmHg  Pulse 46  Wt 184 lb (83.462 kg)  General: Alert and Oriented, No Acute Distress HEENT: Pupils equal, round, reactive to light. Conjunctivae clear. Moist mucous membranes Lungs: Clear to auscultation bilaterally, no wheezing/ronchi/rales.  Comfortable work of breathing. Good air movement. Cardiac: Regular rate and rhythm. Normal S1/S2.  No  murmurs, rubs, nor gallops.   Extremities: No peripheral edema.  Strong peripheral pulses.  Mental Status: No depression, anxiety, nor agitation. Skin: Warm and dry.  Assessment & Plan: Dorinda HillDonald was seen today for hypertension.  Diagnoses and all orders for this visit:  Essential hypertension  Generalized anxiety disorder -     escitalopram (LEXAPRO) 10 MG tablet; Take 1 tablet (10 mg total) by mouth daily.   Essential hypertension: Uncontrolled chronic condition I propose that he start on a ACE inhibitor help reduce his blood pressure however he declines. Generalized anxiety disorder: Uncontrolled, start low-dose Lexapro and call if any symptoms deteriorate or new side effects occur  Return in about 4 weeks (around 10/19/2015) for mood.

## 2015-09-28 ENCOUNTER — Other Ambulatory Visit: Payer: Self-pay | Admitting: Family Medicine

## 2015-10-03 ENCOUNTER — Other Ambulatory Visit: Payer: Self-pay | Admitting: Family Medicine

## 2015-10-12 DIAGNOSIS — Z96641 Presence of right artificial hip joint: Secondary | ICD-10-CM | POA: Insufficient documentation

## 2015-11-24 ENCOUNTER — Encounter: Payer: Self-pay | Admitting: Family Medicine

## 2015-11-24 ENCOUNTER — Ambulatory Visit (INDEPENDENT_AMBULATORY_CARE_PROVIDER_SITE_OTHER): Payer: BLUE CROSS/BLUE SHIELD | Admitting: Family Medicine

## 2015-11-24 VITALS — BP 148/94 | HR 86 | Wt 181.0 lb

## 2015-11-24 DIAGNOSIS — F329 Major depressive disorder, single episode, unspecified: Secondary | ICD-10-CM | POA: Diagnosis not present

## 2015-11-24 DIAGNOSIS — N400 Enlarged prostate without lower urinary tract symptoms: Secondary | ICD-10-CM

## 2015-11-24 DIAGNOSIS — I10 Essential (primary) hypertension: Secondary | ICD-10-CM | POA: Diagnosis not present

## 2015-11-24 DIAGNOSIS — Z23 Encounter for immunization: Secondary | ICD-10-CM

## 2015-11-24 DIAGNOSIS — F32A Depression, unspecified: Secondary | ICD-10-CM | POA: Insufficient documentation

## 2015-11-24 MED ORDER — LISINOPRIL 10 MG PO TABS: 10.0000 mg | ORAL_TABLET | Freq: Every day | ORAL | 3 refills | Status: DC

## 2015-11-24 MED ORDER — DOXAZOSIN MESYLATE 8 MG PO TABS: 4.0000 mg | ORAL_TABLET | Freq: Every day | ORAL | 1 refills | Status: DC

## 2015-11-24 NOTE — Progress Notes (Signed)
CC: William Lane is a 65 y.o. male is here for f/u after hip surgery   Subjective: HPI:  Follow-up BPH: He complains of urinary frequency when he was taking 8 mg of doxazosin and is only willing to take 4 mg daily. He wants another medication to help address his blood pressure. When he checks at home is usually in the stage I hypertension range. He denies any urinary complaints today  he took Lexapro for about 20 days and felt blunted to the point where he didn't want take it anymore. He is no longer taking Lexapro. He still has times when he is irritable or down on himself and believes it's only coming fromhis children hassling him about a possible inheritance. He denies thoughts of harm self or others. Symptoms are mild in severity   Review Of Systems Outlined In HPI  Past Medical History:  Diagnosis Date  . Hyperlipidemia   . Hypertension     No past surgical history on file. Family History  Problem Relation Age of Onset  . Alzheimer's disease Mother   . Hyperlipidemia      Social History   Social History  . Marital status: Married    Spouse name: N/A  . Number of children: N/A  . Years of education: N/A   Occupational History  . Not on file.   Social History Main Topics  . Smoking status: Never Smoker  . Smokeless tobacco: Not on file  . Alcohol use 5.0 oz/week    10 drink(s) per week  . Drug use: No  . Sexual activity: Yes   Other Topics Concern  . Not on file   Social History Narrative  . No narrative on file     Objective: BP (!) 148/94   Pulse 86   Wt 181 lb (82.1 kg)   BMI 25.97 kg/m   Vital signs reviewed. General: Alert and Oriented, No Acute Distress HEENT: Pupils equal, round, reactive to light. Conjunctivae clear.  External ears unremarkable.  Moist mucous membranes. Lungs: Clear and comfortable work of breathing, speaking in full sentences without accessory muscle use. Cardiac: Regular rate and rhythm.  Neuro: CN II-XII grossly intact,  gait normal. Extremities: No peripheral edema.  Strong peripheral pulses.  Mental Status: No depression, anxiety, nor agitation. Logical though process. Skin: Warm and dry.  Assessment & Plan: William Lane was seen today for f/u after hip surgery.  Diagnoses and all orders for this visit:  BPH (benign prostatic hyperplasia)  Essential hypertension  Depression  Encounter for immunization -     Flu Vaccine QUAD 36+ mos IM  Other orders -     doxazosin (CARDURA) 8 MG tablet; Take 0.5 tablets (4 mg total) by mouth daily. -     lisinopril (PRINIVIL,ZESTRIL) 10 MG tablet; Take 1 tablet (10 mg total) by mouth daily.   BPH: Controlled with 4 mg of doxazosin Essential hypertension: uncontrolled chronic condition starting lisinopril Depression: Mildly uncontrolled, I gave him the contact info for awakenings, he would lie to seek out  Counseling  Discussed with this patient that I will be resigning from my position here with [facility] in September in order to stay with my family [place]. I let him know about the providers that are still accepting patients and I feel that this individual will be under great care if he/she stays here with [facility].  Return in about 3 months (around 02/24/2016).

## 2015-12-19 ENCOUNTER — Other Ambulatory Visit: Payer: Self-pay | Admitting: Family Medicine

## 2015-12-21 ENCOUNTER — Ambulatory Visit (INDEPENDENT_AMBULATORY_CARE_PROVIDER_SITE_OTHER): Payer: BLUE CROSS/BLUE SHIELD | Admitting: Family Medicine

## 2015-12-21 ENCOUNTER — Encounter: Payer: Self-pay | Admitting: Family Medicine

## 2015-12-21 VITALS — BP 144/94 | HR 74 | Wt 181.0 lb

## 2015-12-21 DIAGNOSIS — F308 Other manic episodes: Secondary | ICD-10-CM | POA: Insufficient documentation

## 2015-12-21 DIAGNOSIS — F329 Major depressive disorder, single episode, unspecified: Secondary | ICD-10-CM | POA: Diagnosis not present

## 2015-12-21 DIAGNOSIS — I10 Essential (primary) hypertension: Secondary | ICD-10-CM

## 2015-12-21 DIAGNOSIS — E785 Hyperlipidemia, unspecified: Secondary | ICD-10-CM

## 2015-12-21 DIAGNOSIS — R972 Elevated prostate specific antigen [PSA]: Secondary | ICD-10-CM

## 2015-12-21 DIAGNOSIS — F32A Depression, unspecified: Secondary | ICD-10-CM

## 2015-12-21 MED ORDER — LAMOTRIGINE 25 MG PO TABS: ORAL_TABLET | ORAL | 1 refills | Status: DC

## 2015-12-21 NOTE — Progress Notes (Signed)
William Lane is a 65 y.o. male who presents to [facility]: [facility] today for discuss mood, and other health concerns.  Mood: Patient notes decreasing sleep and worsening racing thoughts. His psychologist is concerned for bipolar disorder and recommends Lamictal. Patient notes in the past he did very badly with Lexapro causing worsening anxiety symptoms. He denies any SI or HI. He denies any hallucinations or delusions currently. He denies a personal history or family history for bipolar disorder.  Hypertension and hyperlipidemia: Typically well-controlled. No chest pains palpitations shortness of breath.  Elevated PSA: Patient has a history of BPH with elevated PSA. He's had prostate biopsies in the past. He feels that he urinates reasonably well but does have some urinary retention.   Past Medical History:  Diagnosis Date  . Hyperlipidemia   . Hypertension    No past surgical history on file. Social History  Substance Use Topics  . Smoking status: Never Smoker  . Smokeless tobacco: Not on file  . Alcohol use 5.0 oz/week    10 drink(s) per week   family history includes Alzheimer's disease in his mother.  ROS as above:  Medications: Current Outpatient Prescriptions  Medication Sig Dispense Refill  . aspirin 81 MG tablet Take 81 mg by mouth daily.    Marland Kitchen atorvastatin (LIPITOR) 40 MG tablet TAKE 1 TABLET (40 MG TOTAL) BY MOUTH DAILY. 90 tablet 1  . celecoxib (CELEBREX) 200 MG capsule Take 1 capsule (200 mg total) by mouth 2 (two) times daily as needed for moderate pain. (Meloxicam ineffective) 60 capsule 5  . doxazosin (CARDURA) 8 MG tablet Take 0.5 tablets (4 mg total) by mouth daily. 90 tablet 1  . lisinopril (PRINIVIL,ZESTRIL) 10 MG tablet Take 1 tablet (10 mg total) by mouth daily. 90 tablet 3  . niacin (NIASPAN) 1000 MG CR tablet TAKE 1 TABLET (1,000 MG TOTAL) BY  MOUTH AT BEDTIME. 90 tablet 1  . Vitamin D, Ergocalciferol, (DRISDOL) 50000 units CAPS capsule NEEDS LABS CHECKED. TAKE ONE CAPSULE BY MOUTH EVERY 7 DAYS 4 capsule 0  . lamoTRIgine (LAMICTAL) 25 MG tablet 1 pill daily weeks 1 and 2 followed by 2 pills daily weeks 3 and 4. 45 tablet 1   No current facility-administered medications for this visit.    No Known Allergies   Exam:  BP (!) 144/94   Pulse 74   Wt 181 lb (82.1 kg)   BMI 25.97 kg/m  Gen: Well NAD HEENT: EOMI,  MMM Lungs: Normal work of breathing. CTABL Heart: RRR no MRG Abd: NABS, Soft. Nondistended, Nontender Exts: Brisk capillary refill, warm and well perfused.  Psych: Alert and oriented normal speech thought process and affect.  MDQ is positive  Depression screen PHQ 2/9 12/21/2015  Decreased Interest 0  Down, Depressed, Hopeless 0  PHQ - 2 Score 0    GAD 7 : Generalized Anxiety Score 12/21/2015  Nervous, Anxious, on Edge 2  Control/stop worrying 0  Worry too much - different things 1  Trouble relaxing 1  Restless 1  Easily annoyed or irritable 1  Afraid - awful might happen 0  Total GAD 7 Score 6  Anxiety Difficulty Somewhat difficult      No results found for this or any previous visit (from the past 24 hour(s)). No results found.    Assessment and Plan: 65 y.o. male with  Hypomania: Suspect patient has hypomania based on symptoms. Treat with low-dose Lamictal. Recheck in 4  weeks refer to psychiatry.  Hypertension and hyperlipidemia: Obtain fasting labs continue current regimen  Elevated PSA: Check PSA. Increase doxazosin.   Orders Placed This Encounter  Procedures  . CBC  . Comprehensive metabolic panel    Order Specific Question:   Has the patient fasted?    Answer:   No  . Lipid panel    Order Specific Question:   Has the patient fasted?    Answer:   No  . Hemoglobin A1c  . TSH  . T4, free  . T3, free  . VITAMIN D 25 Hydroxy (Vit-D Deficiency, Fractures)  . PSA  . Ambulatory  referral to Psychiatry    Referral Priority:   Routine    Referral Type:   Psychiatric    Referral Reason:   Specialty Services Required    Referred to Provider:   Thresa RossNadeem Akhtar, MD    Requested Specialty:   Psychiatry    Number of Visits Requested:   1    Discussed warning signs or symptoms. Please see discharge instructions. Patient expresses understanding.

## 2015-12-21 NOTE — Patient Instructions (Signed)
Thank you for coming in today. START lamictal.  Take 1 pill daily for 2 weeks  Increase to 2 pills daily after 2 weeks.  Return in 3- 4 weeks for recheck,  You should hear from psychiatry soon.  Return sooner if needed.  Increase doxazosin to 8mg  daily.   Lamotrigine tablets What is this medicine? LAMOTRIGINE (la MOE Patrecia Pace) is used to control seizures in adults and children with epilepsy and Lennox-Gastaut syndrome. It is also used in adults to treat bipolar disorder. This medicine may be used for other purposes; ask your health care provider or pharmacist if you have questions. What should I tell my health care provider before I take this medicine? They need to know if you have any of these conditions: -a history of depression or bipolar disorder -aseptic meningitis during prior use of lamotrigine -folate deficiency -kidney disease -liver disease -suicidal thoughts, plans, or attempt; a previous suicide attempt by you or a family member -an unusual or allergic reaction to lamotrigine or other seizure medications, other medicines, foods, dyes, or preservatives -pregnant or trying to get pregnant -breast-feeding How should I use this medicine? Take this medicine by mouth with a glass of water. Follow the directions on the prescription label. Do not chew these tablets. If this medicine upsets your stomach, take it with food or milk. Take your doses at regular intervals. Do not take your medicine more often than directed. A special MedGuide will be given to you by the pharmacist with each new prescription and refill. Be sure to read this information carefully each time. Talk to your pediatrician regarding the use of this medicine in children. While this drug may be prescribed for children as young as 2 years for selected conditions, precautions do apply. Overdosage: If you think you have taken too much of this medicine contact a poison control center or emergency room at once. NOTE: This  medicine is only for you. Do not share this medicine with others. What if I miss a dose? If you miss a dose, take it as soon as you can. If it is almost time for your next dose, take only that dose. Do not take double or extra doses. What may interact with this medicine? -carbamazepine -male hormones, including contraceptive or birth control pills -methotrexate -phenobarbital -phenytoin -primidone -pyrimethamine -rifampin -trimethoprim -valproic acid This list may not describe all possible interactions. Give your health care provider a list of all the medicines, herbs, non-prescription drugs, or dietary supplements you use. Also tell them if you smoke, drink alcohol, or use illegal drugs. Some items may interact with your medicine. What should I watch for while using this medicine? Visit your doctor or health care professional for regular checks on your progress. If you take this medicine for seizures, wear a Medic Alert bracelet or necklace. Carry an identification card with information about your condition, medicines, and doctor or health care professional. It is important to take this medicine exactly as directed. When first starting treatment, your dose will need to be adjusted slowly. It may take weeks or months before your dose is stable. You should contact your doctor or health care professional if your seizures get worse or if you have any new types of seizures. Do not stop taking this medicine unless instructed by your doctor or health care professional. Stopping your medicine suddenly can increase your seizures or their severity. Contact your doctor or health care professional right away if you develop a rash while taking this medicine. Rashes  may be very severe and sometimes require treatment in the hospital. Deaths from rashes have occurred. Serious rashes occur more often in children than adults taking this medicine. It is more common for these serious rashes to occur during the  first 2 months of treatment, but a rash can occur at any time. You may get drowsy, dizzy, or have blurred vision. Do not drive, use machinery, or do anything that needs mental alertness until you know how this medicine affects you. To reduce dizzy or fainting spells, do not sit or stand up quickly, especially if you are an older patient. Alcohol can increase drowsiness and dizziness. Avoid alcoholic drinks. If you are taking this medicine for bipolar disorder, it is important to report any changes in your mood to your doctor or health care professional. If your condition gets worse, you get mentally depressed, feel very hyperactive or manic, have difficulty sleeping, or have thoughts of hurting yourself or committing suicide, you need to get help from your health care professional right away. If you are a caregiver for someone taking this medicine for bipolar disorder, you should also report these behavioral changes right away. The use of this medicine may increase the chance of suicidal thoughts or actions. Pay special attention to how you are responding while on this medicine. Your mouth may get dry. Chewing sugarless gum or sucking hard candy, and drinking plenty of water may help. Contact your doctor if the problem does not go away or is severe. Women who become pregnant while using this medicine may enroll in the [place] Antiepileptic Drug Pregnancy Registry by calling 206-765-53491-7625528400. This registry collects information about the safety of antiepileptic drug use during pregnancy. What side effects may I notice from receiving this medicine? Side effects you should report to your doctor or health care professional as soon as possible: -allergic reactions like skin rash, itching or hives, swelling of the face, lips, or tongue -blurred or double vision -difficulty walking or controlling muscle movements -fever -headache, stiff neck, and sensitivity to light -painful sores in the mouth, eyes, or  nose -redness, blistering, peeling or loosening of the skin, including inside the mouth -severe muscle pain -swollen lymph glands -uncontrollable eye movements -unusual bruising or bleeding -unusually weak or tired -vomiting -worsening of mood, thoughts or actions of suicide or dying -yellowing of the eyes or skin Side effects that usually do not require medical attention (report to your doctor or health care professional if they continue or are bothersome): -diarrhea or constipation -difficulty sleeping -nausea -tremors This list may not describe all possible side effects. Call your doctor for medical advice about side effects. You may report side effects to FDA at 1-800-FDA-1088. Where should I keep my medicine? Keep out of reach of children. Store at room temperature between 15 and 30 degrees C (59 and 86 degrees F). Throw away any unused medicine after the expiration date. NOTE: This sheet is a summary. It may not cover all possible information. If you have questions about this medicine, talk to your doctor, pharmacist, or health care provider.    2016, Elsevier/Gold Standard. (2010-03-23 12:21:39)

## 2015-12-22 LAB — CBC
HCT: 38.3 % — ABNORMAL LOW (ref 38.5–50.0)
HEMOGLOBIN: 12.8 g/dL — AB (ref 13.2–17.1)
MCH: 29.5 pg (ref 27.0–33.0)
MCHC: 33.4 g/dL (ref 32.0–36.0)
MCV: 88.2 fL (ref 80.0–100.0)
MPV: 10 fL (ref 7.5–12.5)
Platelets: 158 10*3/uL (ref 140–400)
RBC: 4.34 MIL/uL (ref 4.20–5.80)
RDW: 13.8 % (ref 11.0–15.0)
WBC: 4.3 10*3/uL (ref 3.8–10.8)

## 2015-12-22 LAB — T4, FREE: Free T4: 1.1 ng/dL (ref 0.8–1.8)

## 2015-12-22 LAB — TSH: TSH: 1.47 m[IU]/L (ref 0.40–4.50)

## 2015-12-22 LAB — T3, FREE: T3, Free: 3.3 pg/mL (ref 2.3–4.2)

## 2015-12-22 LAB — PSA: PSA: 4.7 ng/mL — AB (ref <=4.0)

## 2015-12-23 LAB — COMPREHENSIVE METABOLIC PANEL
ALBUMIN: 4 g/dL (ref 3.6–5.1)
ALT: 28 U/L (ref 9–46)
AST: 29 U/L (ref 10–35)
Alkaline Phosphatase: 58 U/L (ref 40–115)
BUN: 15 mg/dL (ref 7–25)
CALCIUM: 9.2 mg/dL (ref 8.6–10.3)
CHLORIDE: 106 mmol/L (ref 98–110)
CO2: 23 mmol/L (ref 20–31)
Creat: 0.99 mg/dL (ref 0.70–1.25)
Glucose, Bld: 105 mg/dL — ABNORMAL HIGH (ref 65–99)
POTASSIUM: 3.9 mmol/L (ref 3.5–5.3)
Sodium: 139 mmol/L (ref 135–146)
TOTAL PROTEIN: 6.6 g/dL (ref 6.1–8.1)
Total Bilirubin: 0.7 mg/dL (ref 0.2–1.2)

## 2015-12-23 LAB — LIPID PANEL
CHOL/HDL RATIO: 2.5 ratio (ref <=5.0)
CHOLESTEROL: 178 mg/dL (ref 125–200)
HDL: 72 mg/dL (ref >=40)
LDL Cholesterol: 90 mg/dL (ref <130)
TRIGLYCERIDES: 80 mg/dL (ref <150)
VLDL: 16 mg/dL (ref <30)

## 2015-12-23 LAB — HEMOGLOBIN A1C
Hgb A1c MFr Bld: 5.2 % (ref <5.7)
Mean Plasma Glucose: 103 mg/dL

## 2015-12-23 LAB — VITAMIN D 25 HYDROXY (VIT D DEFICIENCY, FRACTURES): VIT D 25 HYDROXY: 34 ng/mL (ref 30–100)

## 2016-01-06 ENCOUNTER — Ambulatory Visit (INDEPENDENT_AMBULATORY_CARE_PROVIDER_SITE_OTHER): Payer: BLUE CROSS/BLUE SHIELD | Admitting: Psychiatry

## 2016-01-06 ENCOUNTER — Encounter (HOSPITAL_COMMUNITY): Payer: Self-pay | Admitting: Psychiatry

## 2016-01-06 ENCOUNTER — Telehealth: Payer: Self-pay | Admitting: Family Medicine

## 2016-01-06 VITALS — BP 122/80 | HR 70 | Ht 70.0 in | Wt 187.0 lb

## 2016-01-06 DIAGNOSIS — F4323 Adjustment disorder with mixed anxiety and depressed mood: Secondary | ICD-10-CM

## 2016-01-06 DIAGNOSIS — F4321 Adjustment disorder with depressed mood: Secondary | ICD-10-CM

## 2016-01-06 DIAGNOSIS — F3181 Bipolar II disorder: Secondary | ICD-10-CM

## 2016-01-06 NOTE — Telephone Encounter (Signed)
Pt stopped in to say that the doxazosin he is taking is making his mouth extremely dry. He asked what he could do to help combat the dryness or if there was something he could take. Please advise. Thanks!

## 2016-01-06 NOTE — Progress Notes (Signed)
Psychiatric Initial Adult Assessment   Patient Identification: William Lane MRN:  [id number] Date of Evaluation:  01/06/2016 Referral Source: Dr. Denyse Amass Chief Complaint:   Chief Complaint    Establish Care     Visit Diagnosis:    ICD-9-CM ICD-10-CM   1. Bipolar 2 disorder (HCC) 296.89 F31.81   2. Adjustment disorder with mixed anxiety and depressed mood 309.28 F43.23   3. Unresolved grief 309.1 F43.21     History of Present Illness:  65 years old married Caucasian male referred by Dr. Denyse Amass for management of anxiety and mood symptoms. Patient lost his dad in 2003-09-05 since then he has felt some grief. Says that he still has not recovered from that and suffers from anxiety. At times excessive worries. He says he talks a lot for the last 3-6 months he has noticed sometimes he has difficulty stopping his thoughts. Mind racing. Excessive energy and then he transferred himself that may happen for 1 or 2 days. There is no associated psychotic symptoms.  He was started on an SSRI that made him worse so he stopped that recently Dr. Kandee Keen started him on lamotrigine that has helped he feels that he is more calm and less anxious. No rash He still wants to understand his anxiety and his increased energy level he tries to give himself busy plays competitively golf and watch sports At times he does feel down and decreased energy the way he was murdered even he thinks about his dad and the way he was murdered he was a successful businessman.  Severity of depression: 7/10. 10 being no depression Anxiety: 5/10. 10 being extreme anxiety. .  Infrequent panic attacks   Aggravating factor: sons addiction. Dad death in 09-05-03. 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 2 alcohol drinks a night . Has cut down further down then this since lamictal started  Consequences of Substance Abuse: NA  Past Medical History:  Past Medical History:  Diagnosis Date  . Hyperlipidemia   . Hypertension    History reviewed. No pertinent surgical history.  Family Psychiatric History: not aware. Possible Mom had anxiety Son has Drug dependency  Family History:  Family History  Problem Relation Age of Onset  . Alzheimer's disease Mother   . Hyperlipidemia      Social History:   Social History   Social History  . Marital status: Married    Spouse name: N/A  . Number of children: N/A  . Years of education: N/A   Social History Main Topics  . Smoking status: Never Smoker  . Smokeless tobacco: Never Used  . Alcohol use 5.0 oz/week    10 Standard drinks or equivalent per week  . Drug use: No  . Sexual activity: Yes   Other Topics Concern  . None   Social History Narrative  . None    Additional Social History: Grew up with his parents his dad was a World War II veteran high energized person. He has a good childhood. 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 atorvastatin (LIPITOR) 40 MG tablet TAKE 1 TABLET (40 MG TOTAL) BY MOUTH DAILY. 90 tablet 1  . celecoxib (CELEBREX) 200 MG  capsule Take 1 capsule (200 mg total) by mouth 2 (two) times daily as needed for moderate pain. (Meloxicam ineffective) 60 capsule 5  . doxazosin (CARDURA) 8 MG tablet Take 0.5 tablets (4 mg total) by mouth daily. 90 tablet 1  . lamoTRIgine (LAMICTAL) 25 MG tablet 1 pill daily weeks 1 and 2 followed by 2 pills daily weeks 3 and 4. 45 tablet 1  . lisinopril (PRINIVIL,ZESTRIL) 10 MG tablet Take 1 tablet (10 mg total) by mouth daily. 90 tablet 3  . niacin (NIASPAN) 1000 MG CR tablet TAKE 1 TABLET (1,000 MG TOTAL) BY MOUTH AT BEDTIME. 90 tablet 1  . Vitamin D, Ergocalciferol, (DRISDOL) 50000 units CAPS capsule NEEDS LABS CHECKED. TAKE ONE CAPSULE BY MOUTH EVERY 7 DAYS 4 capsule 0  . aspirin 81 MG tablet Take 81 mg by mouth daily.     No current facility-administered medications for this visit.     Neurologic: Headache: No Seizure: No Paresthesias:No  Musculoskeletal: Strength & Muscle Tone: within normal limits Gait & Station: normal Patient leans: no lean  Psychiatric Specialty Exam: Review of Systems  Cardiovascular: Negative for chest pain.  Skin: Negative for rash.  Neurological: Negative for headaches.  Psychiatric/Behavioral: The patient is nervous/anxious.     Blood pressure 122/80, pulse 70, height 5\' 10"  (1.778 m), weight 187 lb (84.8 kg), SpO2 96 %.Body mass index is 26.83 kg/m.  General Appearance: Casual  Eye Contact:  Good  Speech:  Normal Rate  Volume:  Normal  Mood:  Euthymic  Affect:  Congruent  Thought Process:  Goal Directed  Orientation:  Full (Time, Place, and Person)  Thought Content:  Rumination  Suicidal Thoughts:  No  Homicidal Thoughts:  No  Memory:  Immediate;   Fair Recent;   Good  Judgement:  Fair  Insight:  Fair  Psychomotor Activity:  Normal  Concentration:  Concentration: Fair and Attention Span: Good  Recall:  Good  Fund of Knowledge:Good  Language: Good  Akathisia:  No  Handed:  Right  AIMS (if indicated):    Assets:  Desire for  Improvement  ADL's:  Intact  Cognition: WNL  Sleep:  fair    Treatment Plan Summary: Medication management and Plan as follows  Anxiety may be part of GAD or grief: will recommend therapy.  lamictal has helped his racy thoughs and increased energy level which may be part of  Hypomania . Bipolar 2. Will continue lamictal 50mg  qd.  He has refills  Reviewed sleep hygiene.  He keeps him busy with sports  He wants to understand his grief and anxiety and develop even more better coping skills.   More than 50% time spent in counseling and coordination of care including patient education Follow-up in 4 weeks or earlier if needed   Thresa RossAKHTAR, Jawanda Passey, MD 10/5/20179:14 AM

## 2016-01-07 ENCOUNTER — Ambulatory Visit (INDEPENDENT_AMBULATORY_CARE_PROVIDER_SITE_OTHER): Payer: BLUE CROSS/BLUE SHIELD | Admitting: Licensed Clinical Social Worker

## 2016-01-07 DIAGNOSIS — F411 Generalized anxiety disorder: Secondary | ICD-10-CM | POA: Diagnosis not present

## 2016-01-07 MED ORDER — TAMSULOSIN HCL 0.4 MG PO CAPS: 0.4000 mg | ORAL_CAPSULE | Freq: Every day | ORAL | 3 refills | Status: DC

## 2016-01-07 NOTE — Telephone Encounter (Signed)
Stop doxazosin and start Flomax sent to pharmacy. Return to clinic for recheck in one month
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
## 2016-01-08 ENCOUNTER — Other Ambulatory Visit: Payer: Self-pay | Admitting: Family Medicine

## 2016-01-08 DIAGNOSIS — F411 Generalized anxiety disorder: Secondary | ICD-10-CM

## 2016-01-11 ENCOUNTER — Encounter: Payer: Self-pay | Admitting: Family Medicine

## 2016-01-11 ENCOUNTER — Ambulatory Visit (INDEPENDENT_AMBULATORY_CARE_PROVIDER_SITE_OTHER): Payer: BLUE CROSS/BLUE SHIELD | Admitting: Family Medicine

## 2016-01-11 VITALS — BP 145/97 | HR 58 | Wt 186.0 lb

## 2016-01-11 DIAGNOSIS — F308 Other manic episodes: Secondary | ICD-10-CM

## 2016-01-11 DIAGNOSIS — Z1159 Encounter for screening for other viral diseases: Secondary | ICD-10-CM

## 2016-01-11 DIAGNOSIS — N400 Enlarged prostate without lower urinary tract symptoms: Secondary | ICD-10-CM

## 2016-01-11 DIAGNOSIS — Z114 Encounter for screening for human immunodeficiency virus [HIV]: Secondary | ICD-10-CM

## 2016-01-11 DIAGNOSIS — Z23 Encounter for immunization: Secondary | ICD-10-CM | POA: Diagnosis not present

## 2016-01-11 DIAGNOSIS — F3342 Major depressive disorder, recurrent, in full remission: Secondary | ICD-10-CM | POA: Diagnosis not present

## 2016-01-11 DIAGNOSIS — I10 Essential (primary) hypertension: Secondary | ICD-10-CM | POA: Diagnosis not present

## 2016-01-11 LAB — COMPLETE METABOLIC PANEL WITH GFR
ALBUMIN: 4.2 g/dL (ref 3.6–5.1)
ALK PHOS: 56 U/L (ref 40–115)
ALT: 27 U/L (ref 9–46)
AST: 26 U/L (ref 10–35)
BUN: 16 mg/dL (ref 7–25)
CALCIUM: 9.2 mg/dL (ref 8.6–10.3)
CHLORIDE: 105 mmol/L (ref 98–110)
CO2: 23 mmol/L (ref 20–31)
Creat: 0.99 mg/dL (ref 0.70–1.25)
GFR, EST NON AFRICAN AMERICAN: 80 mL/min (ref >=60)
Glucose, Bld: 100 mg/dL — ABNORMAL HIGH (ref 65–99)
POTASSIUM: 4.1 mmol/L (ref 3.5–5.3)
Sodium: 138 mmol/L (ref 135–146)
Total Bilirubin: 0.6 mg/dL (ref 0.2–1.2)
Total Protein: 7.1 g/dL (ref 6.1–8.1)

## 2016-01-11 MED ORDER — TAMSULOSIN HCL 0.4 MG PO CAPS: 0.4000 mg | ORAL_CAPSULE | Freq: Every day | ORAL | 3 refills | Status: DC

## 2016-01-11 MED ORDER — LAMOTRIGINE 25 MG PO TABS: 50.0000 mg | ORAL_TABLET | Freq: Every day | ORAL | 3 refills | Status: DC

## 2016-01-11 MED ORDER — LISINOPRIL 20 MG PO TABS: 20.0000 mg | ORAL_TABLET | Freq: Every day | ORAL | 3 refills | Status: DC

## 2016-01-11 NOTE — Patient Instructions (Signed)
Thank you for coming in today. 1) Lamictal: Increase to 50mg  daily (2 pills once daily).  2) Blood pressure: Increase Lisinopril to 20mg  daily.  Get labs today.   Return in 1 month for recheck.  Call or go to the emergency room if you get worse, have trouble breathing, have chest pains, or palpitations.

## 2016-01-11 NOTE — Progress Notes (Signed)
William Lane is a 65 y.o. male who [facility] today for follow-up hypertension BPH and mood.   1) hypertension: Currently taking lisinopril 10 mg daily. He does well with no chest pains palpitations shortness of breath fevers or chills. No lightheadedness or dizziness or dry mouth.  2) BPH: Patient previously was taking doxazosin. In the interim he notes significant difficulty with dry mouth and was switched from doxazosin to tamsulosin. He notes his symptoms are very well controlled and he no longer has obnoxious side effects. He is very satisfied with his symptoms.  3) Mood: Much Better in the last month. Patient takes 50 mg of Lamictal and has seen counseling and psychiatry and feels much better.   Past Medical History:  Diagnosis Date  . Hyperlipidemia   . Hypertension    No past surgical history on file. Social History  Substance Use Topics  . Smoking status: Never Smoker  . Smokeless tobacco: Never Used  . Alcohol use 5.0 oz/week    10 Standard drinks or equivalent per week   family history includes Alzheimer's disease in his mother.  ROS as above:  Medications: Current Outpatient Prescriptions  Medication Sig Dispense Refill  . aspirin 81 MG tablet Take 81 mg by mouth daily.    Marland Kitchen. atorvastatin (LIPITOR) 40 MG tablet TAKE 1 TABLET (40 MG TOTAL) BY MOUTH DAILY. 90 tablet 1  . celecoxib (CELEBREX) 200 MG capsule Take 1 capsule (200 mg total) by mouth 2 (two) times daily as needed for moderate pain. (Meloxicam ineffective) 60 capsule 5  . lamoTRIgine (LAMICTAL) 25 MG tablet Take 2 tablets (50 mg total) by mouth daily. 120 tablet 3  . lisinopril (PRINIVIL,ZESTRIL) 20 MG tablet Take 1 tablet (20 mg total) by mouth daily. 90 tablet 3  . niacin (NIASPAN) 1000 MG CR tablet TAKE 1 TABLET (1,000 MG TOTAL) BY MOUTH AT BEDTIME. 90 tablet 1  .  tamsulosin (FLOMAX) 0.4 MG CAPS capsule Take 1 capsule (0.4 mg total) by mouth daily. 90 capsule 3   No current facility-administered medications for this visit.    No Known Allergies   Exam:  BP (!) 145/97   Pulse (!) 58   Wt 186 lb (84.4 kg)   BMI 26.69 kg/m  Gen: Well NAD HEENT: EOMI,  MMM Lungs: Normal work of breathing. CTABL Heart: RRR no MRG Abd: NABS, Soft. Nondistended, Nontender Exts: Brisk capillary refill, warm and well perfused.  Psych: Alert and oriented normal process and affect.  Depression screen [facility] 01/11/2016 01/11/2016 01/06/2016 12/21/2015  Decreased Interest 0 0 0 0  Down, Depressed, Hopeless 0 0 0 0  PHQ - 2 Score 0 0 0 0    GAD 7 : Generalized Anxiety Score 01/11/2016 01/11/2016 01/06/2016 12/21/2015  Nervous, Anxious, on Edge 0 0 0 2  Control/stop worrying 0 0 0 0  Worry too much - different things 0 0 1 1  Trouble relaxing 0 0 0 1  Restless 0 0 0 1  Easily annoyed or irritable 0 0 0 1  Afraid - awful might happen 0 0 0 0  Total GAD 7 Score 0 0 1 6  Anxiety Difficulty Not difficult at all Not difficult at all Somewhat difficult Somewhat difficult     No results found for this or any previous visit (from the past 24 hour(s)). No results found.    Assessment and Plan: 65 y.o. male with  Hypertension:  Slightly elevated blood pressure. Increase lisinopril to 20 mg and recheck in one month.  BPH: Much better with Flomax. Continue current regimen  Mood: Improved. Continue current regimen.   Orders Placed This Encounter  Procedures  . COMPLETE METABOLIC PANEL WITH GFR  . HIV antibody  . Hepatitis C antibody    Discussed warning signs or symptoms. Please see discharge instructions. Patient expresses understanding.

## 2016-01-12 ENCOUNTER — Other Ambulatory Visit: Payer: Self-pay | Admitting: *Deleted

## 2016-01-12 LAB — HEPATITIS C ANTIBODY: HCV AB: NEGATIVE

## 2016-01-12 LAB — HIV ANTIBODY (ROUTINE TESTING W REFLEX): HIV: NONREACTIVE

## 2016-01-12 MED ORDER — ATORVASTATIN CALCIUM 40 MG PO TABS: ORAL_TABLET | ORAL | 1 refills | Status: DC

## 2016-01-14 ENCOUNTER — Other Ambulatory Visit: Payer: Self-pay | Admitting: Family Medicine

## 2016-01-21 ENCOUNTER — Other Ambulatory Visit: Payer: Self-pay | Admitting: *Deleted

## 2016-01-21 DIAGNOSIS — F308 Other manic episodes: Secondary | ICD-10-CM

## 2016-01-21 DIAGNOSIS — F3342 Major depressive disorder, recurrent, in full remission: Secondary | ICD-10-CM

## 2016-01-21 MED ORDER — VITAMIN D (ERGOCALCIFEROL) 1.25 MG (50000 UNIT) PO CAPS: ORAL_CAPSULE | ORAL | 0 refills | Status: DC

## 2016-01-21 MED ORDER — LAMOTRIGINE 25 MG PO TABS: 50.0000 mg | ORAL_TABLET | Freq: Every day | ORAL | 0 refills | Status: DC

## 2016-01-21 NOTE — Progress Notes (Signed)
Patient requests 90 day supply of lamictal.60 day supply was sent. Patient does have a f/u appointment scheduled for 11/09.

## 2016-01-21 NOTE — Progress Notes (Signed)
Patient request 90 day supply. Most recent labs showed vitamin D stable. 90 day supply sent.

## 2016-01-27 ENCOUNTER — Encounter (HOSPITAL_COMMUNITY): Payer: Self-pay | Admitting: Psychiatry

## 2016-01-27 ENCOUNTER — Ambulatory Visit (INDEPENDENT_AMBULATORY_CARE_PROVIDER_SITE_OTHER): Payer: BLUE CROSS/BLUE SHIELD | Admitting: Psychiatry

## 2016-01-27 VITALS — BP 118/68 | HR 64 | Resp 14 | Ht 70.0 in | Wt 187.0 lb

## 2016-01-27 DIAGNOSIS — F411 Generalized anxiety disorder: Secondary | ICD-10-CM

## 2016-01-27 DIAGNOSIS — Z8489 Family history of other specified conditions: Secondary | ICD-10-CM

## 2016-01-27 DIAGNOSIS — F3181 Bipolar II disorder: Secondary | ICD-10-CM

## 2016-01-27 DIAGNOSIS — F4323 Adjustment disorder with mixed anxiety and depressed mood: Secondary | ICD-10-CM

## 2016-01-27 DIAGNOSIS — Z818 Family history of other mental and behavioral disorders: Secondary | ICD-10-CM | POA: Diagnosis not present

## 2016-01-27 DIAGNOSIS — Z7982 Long term (current) use of aspirin: Secondary | ICD-10-CM

## 2016-01-27 DIAGNOSIS — Z79899 Other long term (current) drug therapy: Secondary | ICD-10-CM

## 2016-01-27 NOTE — Progress Notes (Signed)
[facility] Outpatient Follow up visit  Patient Identification: William Lane MRN:  [id number] Date of Evaluation:  01/27/2016 Referral Source: Dr. Denyse Amass Chief Complaint:   Chief Complaint    Follow-up     Visit Diagnosis:    ICD-9-CM ICD-10-CM   1. Bipolar 2 disorder (HCC) 296.89 F31.81   2. Anxiety state 300.00 F41.1   3. Adjustment disorder with mixed anxiety and depressed mood 309.28 F43.23     History of Present Illness:  65 years old married Caucasian male initially referred by Dr. Denyse Amass for management of anxiety and mood symptoms. Patient had presented with the following "lost his dad in 2003-09-01 since then he has felt some grief. Says that he still has not recovered from that and suffers from anxiety. At times excessive worries.  He was started on an SSRI that made him worse so he stopped that recently Dr. Kandee Keen started him on lamotrigine that has helped he feels that he is more calm and less anxious. No rash He still wants to understand his anxiety and his increased energy level he tries to give himself busy plays competitively golf and watch sports"    We kept him on lamictal. Doing better also feels faith helps anxiety. Now working at [employer], plays golf. Less worried.  Severity of depression: 7/10. 10 being no depression Anxiety: 4/10. 10 being extreme anxiety. .  Infrequent panic attacks   Aggravating factor: sons addiction. Dad death in 2003/09/01. 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 2 alcohol drinks a night . Has cut down further down then this since lamictal started    Past Medical History:  Past  Medical History:  Diagnosis Date  . Hyperlipidemia   . Hypertension    History reviewed. No pertinent surgical history.  Family Psychiatric History: not aware. Possible Mom had anxiety Son has Drug dependency  Family History:  Family History  Problem Relation Age of Onset  . Alzheimer's disease Mother   . Hyperlipidemia      Social History:   Social History   Social History  . Marital status: Married    Spouse name: N/A  . Number of children: N/A  . Years of education: N/A   Social History Main Topics  . Smoking status: Never Smoker  . Smokeless tobacco: Never Used  . Alcohol use 5.0 oz/week    10 Standard drinks or equivalent per week  . Drug use: No  . Sexual activity: Yes   Other Topics Concern  . None   Social History Narrative  . None     Allergies:   Allergies  Allergen Reactions  . Codeine Other (See Comments)    hallucinations      Metabolic Disorder Labs: Lab Results  Component Value Date   HGBA1C 5.2 12/21/2015   MPG 103 12/21/2015   No results found for: PROLACTIN Lab Results  Component Value Date   CHOL 178 12/21/2015   TRIG 80 12/21/2015   HDL 72 12/21/2015   CHOLHDL 2.5 12/21/2015   VLDL 16 12/21/2015   LDLCALC 90 12/21/2015   LDLCALC 105 06/21/2015     Current Medications: Current Outpatient Prescriptions  Medication Sig Dispense Refill  . aspirin 81 MG  tablet Take 81 mg by mouth daily.    Marland Kitchen. atorvastatin (LIPITOR) 40 MG tablet TAKE 1 TABLET (40 MG TOTAL) BY MOUTH DAILY. 90 tablet 1  . celecoxib (CELEBREX) 200 MG capsule Take 1 capsule (200 mg total) by mouth 2 (two) times daily as needed for moderate pain. (Meloxicam ineffective) 60 capsule 5  . lamoTRIgine (LAMICTAL) 25 MG tablet Take 2 tablets (50 mg total) by mouth daily. 180 tablet 0  . lisinopril (PRINIVIL,ZESTRIL) 20 MG tablet Take 1 tablet (20 mg total) by mouth daily. 90 tablet 3  . niacin (NIASPAN) 1000 MG CR tablet TAKE 1 TABLET (1,000 MG TOTAL) BY MOUTH AT BEDTIME.  90 tablet 1  . tamsulosin (FLOMAX) 0.4 MG CAPS capsule Take 1 capsule (0.4 mg total) by mouth daily. 90 capsule 3  . Vitamin D, Ergocalciferol, (DRISDOL) 50000 units CAPS capsule TAKE ONE CAPSULE BY MOUTH EVERY 7 DAYS 12 capsule 0   No current facility-administered medications for this visit.     Neurologic: Headache: No Seizure: No Paresthesias:No  Musculoskeletal: Strength & Muscle Tone: within normal limits Gait & Station: normal Patient leans: no lean  Psychiatric Specialty Exam: Review of Systems  Cardiovascular: Negative for chest pain.  Skin: Negative for itching and rash.  Neurological: Negative for headaches.  Psychiatric/Behavioral: Negative for depression.    Blood pressure 118/68, pulse 64, resp. rate 14, height 5\' 10"  (1.778 m), weight 187 lb (84.8 kg), SpO2 99 %.Body mass index is 26.83 kg/m.  General Appearance: Casual  Eye Contact:  Good  Speech:  Normal Rate  Volume:  Normal  Mood:  Euthymic  Affect:  Congruent  Thought Process:  Goal Directed  Orientation:  Full (Time, Place, and Person)  Thought Content:  Rumination  Suicidal Thoughts:  No  Homicidal Thoughts:  No  Memory:  Immediate;   Fair Recent;   Good  Judgement:  Fair  Insight:  Fair  Psychomotor Activity:  Normal  Concentration:  Concentration: Fair and Attention Span: Good  Recall:  Good  Fund of Knowledge:Good  Language: Good  Akathisia:  No  Handed:  Right  AIMS (if indicated):    Assets:  Desire for Improvement  ADL's:  Intact  Cognition: WNL  Sleep:  fair    Treatment Plan Summary: Medication management and Plan as follows  Anxiety may be part of GAD or grief: will recommend therapy.  lamictal has helped his racy thoughs and increased energy level which may be part of  Hypomania . Bipolar 2. Will continue lamictal 50mg  qd. He is improving. Will not increase dose.  He wants to cut down sooner or later.   He has meds Call for refill.  Reviewed sleep hygiene.  He keeps him  busy with sports  He wants to understand his grief and anxiety and develop even more better coping skills. Has seen therapist  More than 50% time spent in counseling and coordination of care including patient education Follow-up in 2 months or earlier if needed   Thresa RossAKHTAR, Thai Hemrick, MD 10/26/20178:41 AM

## 2016-02-10 ENCOUNTER — Ambulatory Visit: Payer: Self-pay | Admitting: Family Medicine

## 2016-02-23 ENCOUNTER — Ambulatory Visit (INDEPENDENT_AMBULATORY_CARE_PROVIDER_SITE_OTHER): Payer: BLUE CROSS/BLUE SHIELD | Admitting: Family Medicine

## 2016-02-23 DIAGNOSIS — I10 Essential (primary) hypertension: Secondary | ICD-10-CM

## 2016-02-23 MED ORDER — AMOXICILLIN 500 MG PO CAPS: 1000.0000 mg | ORAL_CAPSULE | Freq: Once | ORAL | 2 refills | Status: AC

## 2016-02-23 NOTE — Patient Instructions (Signed)
Thank you for coming in today. Continue the medicine.  Recheck in 6 months or sooner if needed.  You can make a medicare well visit in May.  Some of that visit will be with a nurse and some with me.   Return sooner if needed.

## 2016-02-23 NOTE — Progress Notes (Signed)
       William Lane is a 65 y.o. male who [facility] today for follow-up hypertension. Patient is doing well with lisinopril 20 mg daily. No chest pain palpitations shortness of breath.  He requests amoxicillin preparation for dental procedure. He has a history of hip replacement.   Past Medical History:  Diagnosis Date  . Hyperlipidemia   . Hypertension    No past surgical history on file. Social History  Substance Use Topics  . Smoking status: Never Smoker  . Smokeless tobacco: Never Used  . Alcohol use 5.0 oz/week    10 Standard drinks or equivalent per week   family history includes Alzheimer's disease in his mother.  ROS as above:  Medications: Current Outpatient Prescriptions  Medication Sig Dispense Refill  . aspirin 81 MG tablet Take 81 mg by mouth daily.    Marland Kitchen. atorvastatin (LIPITOR) 40 MG tablet TAKE 1 TABLET (40 MG TOTAL) BY MOUTH DAILY. 90 tablet 1  . celecoxib (CELEBREX) 200 MG capsule Take 1 capsule (200 mg total) by mouth 2 (two) times daily as needed for moderate pain. (Meloxicam ineffective) 60 capsule 5  . lamoTRIgine (LAMICTAL) 25 MG tablet Take 2 tablets (50 mg total) by mouth daily. 180 tablet 0  . lisinopril (PRINIVIL,ZESTRIL) 20 MG tablet Take 1 tablet (20 mg total) by mouth daily. 90 tablet 3  . niacin (NIASPAN) 1000 MG CR tablet TAKE 1 TABLET (1,000 MG TOTAL) BY MOUTH AT BEDTIME. 90 tablet 1  . tamsulosin (FLOMAX) 0.4 MG CAPS capsule Take 1 capsule (0.4 mg total) by mouth daily. 90 capsule 3  . Vitamin D, Ergocalciferol, (DRISDOL) 50000 units CAPS capsule TAKE ONE CAPSULE BY MOUTH EVERY 7 DAYS 12 capsule 0  . amoxicillin (AMOXIL) 500 MG capsule Take 2 capsules (1,000 mg total) by mouth once. Before dental procedure 4 capsule 2   No current facility-administered medications for this visit.    Allergies  Allergen Reactions    . Codeine Other (See Comments)    hallucinations      Health Maintenance Health Maintenance  Topic Date Due  . COLONOSCOPY  03/08/2025  . TETANUS/TDAP  01/10/2026  . INFLUENZA VACCINE  Completed  . ZOSTAVAX  Completed  . Hepatitis C Screening  Completed  . HIV Screening  Completed     Exam:  BP 129/79   Pulse 70   Wt 188 lb (85.3 kg)   BMI 26.98 kg/m  Gen: Well NAD HEENT: EOMI,  MMM Lungs: Normal work of breathing. CTABL Heart: RRR no MRG Abd: NABS, Soft. Nondistended, Nontender Exts: Brisk capillary refill, warm and well perfused.    No results found for this or any previous visit (from the past 72 hour(s)). No results found.    Assessment and Plan: 65 y.o. male with  Hypertension. Doing well. Continue lisinopril. Recheck in 6 months.  We'll prescribe amoxicillin for use prior to dental procedure for prophylaxis.   No orders of the defined types were placed in this encounter.   Discussed warning signs or symptoms. Please see discharge instructions. Patient expresses understanding.

## 2016-02-29 ENCOUNTER — Ambulatory Visit: Payer: Self-pay | Admitting: Family Medicine

## 2016-03-21 ENCOUNTER — Ambulatory Visit (INDEPENDENT_AMBULATORY_CARE_PROVIDER_SITE_OTHER): Payer: BLUE CROSS/BLUE SHIELD | Admitting: Psychiatry

## 2016-03-21 ENCOUNTER — Encounter (HOSPITAL_COMMUNITY): Payer: Self-pay | Admitting: Psychiatry

## 2016-03-21 VITALS — BP 124/70 | HR 80 | Resp 16 | Ht 70.0 in

## 2016-03-21 DIAGNOSIS — F3342 Major depressive disorder, recurrent, in full remission: Secondary | ICD-10-CM

## 2016-03-21 DIAGNOSIS — F4323 Adjustment disorder with mixed anxiety and depressed mood: Secondary | ICD-10-CM | POA: Diagnosis not present

## 2016-03-21 DIAGNOSIS — F3181 Bipolar II disorder: Secondary | ICD-10-CM | POA: Diagnosis not present

## 2016-03-21 DIAGNOSIS — Z79899 Other long term (current) drug therapy: Secondary | ICD-10-CM

## 2016-03-21 DIAGNOSIS — F411 Generalized anxiety disorder: Secondary | ICD-10-CM | POA: Diagnosis not present

## 2016-03-21 DIAGNOSIS — Z7982 Long term (current) use of aspirin: Secondary | ICD-10-CM

## 2016-03-21 DIAGNOSIS — F308 Other manic episodes: Secondary | ICD-10-CM

## 2016-03-21 DIAGNOSIS — Z888 Allergy status to other drugs, medicaments and biological substances status: Secondary | ICD-10-CM

## 2016-03-21 MED ORDER — LAMOTRIGINE 25 MG PO TABS: 50.0000 mg | ORAL_TABLET | Freq: Every day | ORAL | 0 refills | Status: DC

## 2016-03-21 NOTE — Progress Notes (Signed)
[facility] Outpatient Follow up visit  [facility] MRN:  [id number] Date of Evaluation:  03/21/2016 Referral Source: Dr. Denyse Amassorey Chief Complaint:   Chief Complaint    Follow-up     Visit Diagnosis:    ICD-9-CM ICD-10-CM   1. Bipolar 2 disorder (HCC) 296.89 F31.81   2. Anxiety state 300.00 F41.1   3. Adjustment disorder with mixed anxiety and depressed mood 309.28 F43.23   4. Hypomania (HCC) 296.00 F30.8 lamoTRIgine (LAMICTAL) 25 MG tablet  5. Recurrent major depressive disorder, in full remission (HCC) 296.36 F33.42 lamoTRIgine (LAMICTAL) 25 MG tablet    History of Present Illness:  65 years old married Caucasian male initially referred by Dr. Denyse Amassorey for management of anxiety and mood symptoms.   Patient does initially presented to grief in relation to his dad's death in a few years ago. And depression and anxiety apparently he is doing reasonable on lamotrigine and he is playing golf keeping himself busy he has a supportive wife there is no frequent panic attacks and anxiety is manageable. Modifying factor retired  No related side effects or rash on lamotrigine     Past Psychiatric History: none  Previous Psychotropic Medications: No   Substance Abuse History in the last 12 months:  No. 2 alcohol drinks a night . Has cut down further down then this since lamictal started    Past Medical History:  Past Medical History:  Diagnosis Date  . Hyperlipidemia   . Hypertension    History reviewed. No pertinent surgical history.  Family Psychiatric History: not aware. Possible Mom had anxiety Son has Drug dependency  Family History:  Family History  Problem Relation Age of Onset  . Alzheimer's disease Mother   . Hyperlipidemia      Social History:   Social History   Social History  . Marital status: Married    Spouse name: N/A  . Number of children: N/A  . Years of education: N/A   Social History Main Topics  . Smoking status: Never Smoker   . Smokeless tobacco: Never Used  . Alcohol use 5.0 oz/week    10 Standard drinks or equivalent per week  . Drug use: No  . Sexual activity: Yes   Other Topics Concern  . None   Social History Narrative  . None     Allergies:   Allergies  Allergen Reactions  . Codeine Other (See Comments)    hallucinations      Metabolic Disorder Labs: Lab Results  Component Value Date   HGBA1C 5.2 12/21/2015   MPG 103 12/21/2015   No results found for: PROLACTIN Lab Results  Component Value Date   CHOL 178 12/21/2015   TRIG 80 12/21/2015   HDL 72 12/21/2015   CHOLHDL 2.5 12/21/2015   VLDL 16 12/21/2015   LDLCALC 90 12/21/2015   LDLCALC 105 06/21/2015     Current Medications: Current Outpatient Prescriptions  Medication Sig Dispense Refill  . aspirin 81 MG tablet Take 81 mg by mouth daily.    Marland Kitchen. atorvastatin (LIPITOR) 40 MG tablet TAKE 1 TABLET (40 MG TOTAL) BY MOUTH DAILY. 90 tablet 1  . celecoxib (CELEBREX) 200 MG capsule Take 1 capsule (200 mg total) by mouth 2 (two) times daily as needed for moderate pain. (Meloxicam ineffective) 60 capsule 5  . lamoTRIgine (LAMICTAL) 25 MG tablet Take 2 tablets (50 mg total) by mouth daily. 180 tablet 0  . lisinopril (PRINIVIL,ZESTRIL) 20 MG tablet Take 1 tablet (20 mg total) by mouth  daily. 90 tablet 3  . niacin (NIASPAN) 1000 MG CR tablet TAKE 1 TABLET (1,000 MG TOTAL) BY MOUTH AT BEDTIME. 90 tablet 1  . tamsulosin (FLOMAX) 0.4 MG CAPS capsule Take 1 capsule (0.4 mg total) by mouth daily. 90 capsule 3  . Vitamin D, Ergocalciferol, (DRISDOL) 50000 units CAPS capsule TAKE ONE CAPSULE BY MOUTH EVERY 7 DAYS 12 capsule 0   No current facility-administered medications for this visit.       Psychiatric Specialty Exam: Review of Systems  Cardiovascular: Negative for palpitations.  Skin: Negative for itching and rash.  Neurological: Negative for headaches.  Psychiatric/Behavioral: Negative for depression and suicidal ideas.    Blood  pressure 124/70, pulse 80, resp. rate 16, height 5\' 10"  (1.778 m), SpO2 96 %.There is no height or weight on file to calculate BMI.  General Appearance: Casual  Eye Contact:  Good  Speech:  Normal Rate  Volume:  Normal  Mood: euthymic  Affect:  Congruent  Thought Process:  Goal Directed  Orientation:  Full (Time, Place, and Person)  Thought Content:  Rumination  Suicidal Thoughts:  No  Homicidal Thoughts:  No  Memory:  Immediate;   Fair Recent;   Good  Judgement:  Fair  Insight:  Fair  Psychomotor Activity:  Normal  Concentration:  Concentration: Fair and Attention Span: Good  Recall:  Good  Fund of Knowledge:Good  Language: Good  Akathisia:  No  Handed:  Right  AIMS (if indicated):    Assets:  Desire for Improvement  ADL's:  Intact  Cognition: WNL  Sleep:  fair    Treatment Plan Summary: Medication management and Plan as follows   Bipolar 2; continue lamotrigine 50 mg anxiety has not worsened. Grief is improving. He has supportive wife. New prescription 90 day supply he keeps himself busy with sports and playing golf FU in 3 months or earlier if needed.  Thresa RossAKHTAR, Halyn Flaugher, MD 12/19/20171:52 PM

## 2016-03-24 ENCOUNTER — Ambulatory Visit (HOSPITAL_COMMUNITY): Payer: Self-pay | Admitting: Psychiatry

## 2016-04-13 ENCOUNTER — Other Ambulatory Visit: Payer: Self-pay | Admitting: Family Medicine

## 2016-04-21 ENCOUNTER — Other Ambulatory Visit: Payer: Self-pay | Admitting: Family Medicine

## 2016-06-13 ENCOUNTER — Ambulatory Visit (HOSPITAL_COMMUNITY): Payer: Self-pay | Admitting: Psychiatry

## 2016-07-03 ENCOUNTER — Other Ambulatory Visit: Payer: Self-pay | Admitting: Family Medicine

## 2016-07-06 DIAGNOSIS — H2513 Age-related nuclear cataract, bilateral: Secondary | ICD-10-CM | POA: Diagnosis not present

## 2016-07-06 DIAGNOSIS — H43813 Vitreous degeneration, bilateral: Secondary | ICD-10-CM | POA: Diagnosis not present

## 2016-08-23 ENCOUNTER — Ambulatory Visit (INDEPENDENT_AMBULATORY_CARE_PROVIDER_SITE_OTHER): Payer: Medicare Other | Admitting: Family Medicine

## 2016-08-23 ENCOUNTER — Encounter: Payer: Self-pay | Admitting: Family Medicine

## 2016-08-23 VITALS — BP 124/88 | HR 64 | Ht 70.0 in | Wt 193.0 lb

## 2016-08-23 DIAGNOSIS — I10 Essential (primary) hypertension: Secondary | ICD-10-CM

## 2016-08-23 DIAGNOSIS — E559 Vitamin D deficiency, unspecified: Secondary | ICD-10-CM

## 2016-08-23 DIAGNOSIS — N400 Enlarged prostate without lower urinary tract symptoms: Secondary | ICD-10-CM | POA: Diagnosis not present

## 2016-08-23 DIAGNOSIS — R972 Elevated prostate specific antigen [PSA]: Secondary | ICD-10-CM

## 2016-08-23 DIAGNOSIS — Z0000 Encounter for general adult medical examination without abnormal findings: Secondary | ICD-10-CM | POA: Diagnosis not present

## 2016-08-23 DIAGNOSIS — E782 Mixed hyperlipidemia: Secondary | ICD-10-CM

## 2016-08-23 DIAGNOSIS — Z23 Encounter for immunization: Secondary | ICD-10-CM

## 2016-08-23 LAB — COMPLETE METABOLIC PANEL WITH GFR
ALBUMIN: 4.2 g/dL (ref 3.6–5.1)
ALK PHOS: 60 U/L (ref 40–115)
ALT: 19 U/L (ref 9–46)
AST: 22 U/L (ref 10–35)
BILIRUBIN TOTAL: 0.6 mg/dL (ref 0.2–1.2)
BUN: 17 mg/dL (ref 7–25)
CO2: 19 mmol/L — ABNORMAL LOW (ref 20–31)
Calcium: 9.4 mg/dL (ref 8.6–10.3)
Chloride: 107 mmol/L (ref 98–110)
Creat: 1 mg/dL (ref 0.70–1.25)
GFR, Est African American: >89 mL/min (ref >=60)
GFR, Est Non African American: 79 mL/min (ref >=60)
GLUCOSE: 104 mg/dL — AB (ref 65–99)
Potassium: 4.1 mmol/L (ref 3.5–5.3)
SODIUM: 140 mmol/L (ref 135–146)
TOTAL PROTEIN: 7.2 g/dL (ref 6.1–8.1)

## 2016-08-23 LAB — CBC
HCT: 38.9 % (ref 38.5–50.0)
Hemoglobin: 13.1 g/dL — ABNORMAL LOW (ref 13.2–17.1)
MCH: 30.2 pg (ref 27.0–33.0)
MCHC: 33.7 g/dL (ref 32.0–36.0)
MCV: 89.6 fL (ref 80.0–100.0)
MPV: 10.1 fL (ref 7.5–12.5)
Platelets: 144 10*3/uL (ref 140–400)
RBC: 4.34 MIL/uL (ref 4.20–5.80)
RDW: 13.9 % (ref 11.0–15.0)
WBC: 4 10*3/uL (ref 3.8–10.8)

## 2016-08-23 LAB — LIPID PANEL W/REFLEX DIRECT LDL
Cholesterol: 199 mg/dL (ref <200)
HDL: 53 mg/dL (ref >40)
LDL-Cholesterol: 130 mg/dL — ABNORMAL HIGH
Non-HDL Cholesterol (Calc): 146 mg/dL — ABNORMAL HIGH (ref <130)
Total CHOL/HDL Ratio: 3.8 Ratio (ref <5.0)
Triglycerides: 69 mg/dL (ref <150)

## 2016-08-23 MED ORDER — NIACIN ER (ANTIHYPERLIPIDEMIC) 1000 MG PO TBCR: EXTENDED_RELEASE_TABLET | ORAL | 3 refills | Status: AC

## 2016-08-23 MED ORDER — CELECOXIB 200 MG PO CAPS: 200.0000 mg | ORAL_CAPSULE | Freq: Two times a day (BID) | ORAL | 5 refills | Status: DC | PRN

## 2016-08-23 MED ORDER — ZOSTER VAC RECOMB ADJUVANTED 50 MCG/0.5ML IM SUSR: 0.5000 mL | Freq: Once | INTRAMUSCULAR | 1 refills | Status: AC

## 2016-08-23 MED ORDER — TAMSULOSIN HCL 0.4 MG PO CAPS: 0.4000 mg | ORAL_CAPSULE | Freq: Every day | ORAL | 3 refills | Status: DC

## 2016-08-23 MED ORDER — CETIRIZINE HCL 10 MG PO TABS: 10.0000 mg | ORAL_TABLET | Freq: Every day | ORAL | 3 refills | Status: AC

## 2016-08-23 MED ORDER — LISINOPRIL 20 MG PO TABS: 20.0000 mg | ORAL_TABLET | Freq: Every day | ORAL | 3 refills | Status: DC

## 2016-08-23 MED ORDER — ATORVASTATIN CALCIUM 40 MG PO TABS: ORAL_TABLET | ORAL | 3 refills | Status: DC

## 2016-08-23 NOTE — Patient Instructions (Signed)
Thank you for coming in today. Get labs today.  I refilled medicines.  Recheck in 1 year or sooner if needed.  We will do the pneumonia shot today.  We sent the shingles shot to the pharmacy.  This fall when you get your flu shot just send me a note.   Keep exercising.

## 2016-08-23 NOTE — Progress Notes (Signed)
HPI: William Lane is a 66 y.o. male  who presents to [facility] today, 08/23/16,  for Medicare Annual Wellness Exam  Patient presents for annual physical/Medicare wellness exam. no complaints today.   Past medical, surgical, social and family history reviewed:  Patient Active Problem List   Diagnosis Date Noted  . Hypomania (HCC) 12/21/2015  . Depression 11/24/2015  . Status post total hip replacement, right 10/12/2015  . Degenerative disc disease, lumbar 03/23/2015  . History of colonic polyps 03/09/2015  . Vitamin D deficiency 08/06/2014  . Hyperlipidemia 01/12/2014  . Essential hypertension 01/12/2014  . Elevated PSA 01/12/2014  . BPH (benign prostatic hyperplasia) 01/12/2014    No past surgical history on file.  Social History   Social History  . Marital status: Married    Spouse name: N/A  . Number of children: N/A  . Years of education: N/A   Occupational History  . Not on file.   Social History Main Topics  . Smoking status: Never Smoker  . Smokeless tobacco: Never Used  . Alcohol use 5.0 oz/week    10 Standard drinks or equivalent per week  . Drug use: No  . Sexual activity: Yes   Other Topics Concern  . Not on file   Social History Narrative  . No narrative on file    Family History  Problem Relation Age of Onset  . Alzheimer's disease Mother   . Hyperlipidemia Unknown      Current medication list and allergy/intolerance information reviewed:    Outpatient Encounter Prescriptions as of 08/23/2016  Medication Sig  . aspirin 81 MG tablet Take 81 mg by mouth daily.  Marland Kitchen atorvastatin (LIPITOR) 40 MG tablet TAKE 1 TABLET (40 MG TOTAL) BY MOUTH DAILY.  . celecoxib (CELEBREX) 200 MG capsule Take 1 capsule (200 mg total) by mouth 2 (two) times daily as needed for moderate pain. (Meloxicam ineffective)  . lisinopril (PRINIVIL,ZESTRIL) 20 MG tablet Take 1 tablet (20 mg total) by mouth daily.  . niacin (NIASPAN)  1000 MG CR tablet TAKE 1 TABLET (1,000 MG TOTAL) BY MOUTH AT BEDTIME.  . tamsulosin (FLOMAX) 0.4 MG CAPS capsule Take 1 capsule (0.4 mg total) by mouth daily.  . [DISCONTINUED] atorvastatin (LIPITOR) 40 MG tablet TAKE 1 TABLET (40 MG TOTAL) BY MOUTH DAILY.  . [DISCONTINUED] celecoxib (CELEBREX) 200 MG capsule Take 1 capsule (200 mg total) by mouth 2 (two) times daily as needed for moderate pain. (Meloxicam ineffective)  . [DISCONTINUED] lamoTRIgine (LAMICTAL) 25 MG tablet Take 2 tablets (50 mg total) by mouth daily.  . [DISCONTINUED] lisinopril (PRINIVIL,ZESTRIL) 20 MG tablet Take 1 tablet (20 mg total) by mouth daily.  . [DISCONTINUED] niacin (NIASPAN) 1000 MG CR tablet TAKE 1 TABLET (1,000 MG TOTAL) BY MOUTH AT BEDTIME.  . [DISCONTINUED] tamsulosin (FLOMAX) 0.4 MG CAPS capsule Take 1 capsule (0.4 mg total) by mouth daily.  . [DISCONTINUED] Vitamin D, Ergocalciferol, (DRISDOL) 50000 units CAPS capsule TAKE ONE CAPSULE BY MOUTH EVERY 7 DAYS  . cetirizine (ZYRTEC) 10 MG tablet Take 1 tablet (10 mg total) by mouth daily.  Marland Kitchen Zoster Vac Recomb Adjuvanted [facility]) injection Inject 0.5 mLs into the muscle once. Give again after 2 months. If administered in clinic please fax report to Dr Denyse Amass [phone]   No facility-administered encounter medications on file as of 08/23/2016.     Allergies  Allergen Reactions  . Codeine Other (See Comments)    hallucinations         Review  of Systems: No headache, visual changes, nausea, vomiting, diarrhea, constipation, dizziness, abdominal pain, skin rash, fevers, chills, night sweats, weight loss, swollen lymph nodes, body aches, joint swelling, muscle aches, chest pain, shortness of breath, mood changes, visual or auditory hallucinations.     Medicare Wellness Questionnaire  Are there smokers in your home (other than you)? no  Depression screen [facility] 2/[facility] 08/23/2016 01/11/2016 01/11/2016 12/21/2015  Decreased Interest 0 0 0 0  Down, Depressed,  Hopeless 0 0 0 0  PHQ - 2 Score 0 0 0 0  Some encounter information is confidential and restricted. Go to Review Flowsheets activity to see all data.        Activities of Daily Living In your present state of health, do you have any difficulty performing the following activities?:  Driving? no Managing money?  no Feeding yourself? no Getting from bed to chair? no Climbing a flight of stairs? no Preparing food and eating?: no Bathing or showering? no Getting dressed: no Getting to the toilet? no Using the toilet: no Moving around from place to place: no In the past year have you fallen or had a near fall?: no  Hearing Difficulties:  Do you often ask people to speak up or repeat themselves? no Do you experience ringing or noises in your ears? no  Do you have difficulty understanding soft or whispered voices? no  Memory Difficulties:  Do you feel that you have a problem with memory? no  Do you often misplace items? no  Do you feel safe at home?  yes  Sexual Health:   Are you sexually active?  Yes  Do you have more than one partner?  No   Risk Factors  Current exercise habits: Daily walking  Dietary issues discussed:reduced calorie  Cardiac risk factors: Present   Exam:  BP 124/88   Pulse 64   Ht 5\' 10"  (1.778 m)   Wt 193 lb (87.5 kg)   BMI 27.69 kg/m  Vision by Snellen chart: right eye:see nurse notes, left eye:see nurse notes  Constitutional: VS see above. General Appearance: alert, well-developed, well-nourished, NAD  Ears, Nose, Mouth, Throat: MMM  Neck: No masses, trachea midline.   Respiratory: Normal respiratory effort. no wheeze, no rhonchi, no rales  Cardiovascular:No lower extremity edema.   Musculoskeletal: Gait normal. No clubbing/cyanosis of digits.   Neurological: Normal balance/coordination. No tremor. Recalls 3 objects and able to read face of watch with correct time.   Skin: warm, dry, intact. No rash/ulcer.   Psychiatric: Normal  judgment/insight. Normal mood and affect. Oriented x3.  Prostate: Normal rectal tone enlarged prostate no nodules palpated.   ASSESSMENT/PLAN:   Encounter for Medicare annual wellness exam  Vitamin D deficiency - Plan: CBC, COMPLETE METABOLIC PANEL WITH GFR, Lipid Panel w/reflex Direct LDL, PSA, VITAMIN D 25 Hydroxy (Vit-D Deficiency, Fractures)  Benign prostatic hyperplasia without lower urinary tract symptoms - Plan: tamsulosin (FLOMAX) 0.4 MG CAPS capsule, CBC, COMPLETE METABOLIC PANEL WITH GFR, Lipid Panel w/reflex Direct LDL, PSA, VITAMIN D 25 Hydroxy (Vit-D Deficiency, Fractures)  Essential hypertension - Plan: lisinopril (PRINIVIL,ZESTRIL) 20 MG tablet, CBC, COMPLETE METABOLIC PANEL WITH GFR, Lipid Panel w/reflex Direct LDL, PSA, VITAMIN D 25 Hydroxy (Vit-D Deficiency, Fractures)  Mixed hyperlipidemia - Plan: CBC, COMPLETE METABOLIC PANEL WITH GFR, Lipid Panel w/reflex Direct LDL, PSA, VITAMIN D 25 Hydroxy (Vit-D Deficiency, Fractures)  Elevated PSA - Plan: CBC, COMPLETE METABOLIC PANEL WITH GFR, Lipid Panel w/reflex Direct LDL, PSA, VITAMIN D 25 Hydroxy (Vit-D Deficiency, Fractures)  Health  Maintenance Health Maintenance  Topic Date Due  . PNA vac Low Risk Adult (1 of 2 - PCV13) 07/30/2016  . INFLUENZA VACCINE  11/01/2016  . COLONOSCOPY  03/08/2025  . TETANUS/TDAP  01/10/2026  . Hepatitis C Screening  Completed  . HIV Screening  Completed    Immunization History  Administered Date(s) Administered  . Influenza,inj,Quad PF,36+ Mos 11/24/2015  . Influenza-Unspecified 01/01/2014, 01/02/2015  . Tdap 01/11/2016  . Zoster 04/03/2012     During the course of the visit the patient was educated and counseled about appropriate screening and preventive services as noted above.   Patient Instructions (the written plan) was given to the patient.  Medicare Attestation I have personally reviewed: The patient's medical and social history Their use of alcohol, tobacco or illicit  drugs Their current medications and supplements The patient's functional ability including ADLs,fall risks, home safety risks, cognitive, and hearing and visual impairment Diet and physical activities Evidence for depression or mood disorders  The patient's weight, height, BMI, and visual acuity have been recorded in the chart.  I have made referrals, counseling, and provided education to the patient based on review of the above and I have provided the patient with a written personalized care plan for preventive services.

## 2016-08-24 LAB — PSA: PSA: 4.7 ng/mL — AB (ref <=4.0)

## 2016-08-24 LAB — VITAMIN D 25 HYDROXY (VIT D DEFICIENCY, FRACTURES): Vit D, 25-Hydroxy: 25 ng/mL — ABNORMAL LOW (ref 30–100)

## 2016-08-24 MED ORDER — ATORVASTATIN CALCIUM 80 MG PO TABS: 80.0000 mg | ORAL_TABLET | Freq: Every day | ORAL | 3 refills | Status: DC

## 2016-08-24 NOTE — Addendum Note (Signed)
Addended by: Rodolph BongOREY, Katrina Brosh S on: 08/24/2016 07:06 AM   Modules accepted: Orders

## 2016-08-24 NOTE — Addendum Note (Signed)
Addended by: Minna AntisBRIGHAM, EBONY T on: 08/24/2016 12:14 PM   Modules accepted: Orders

## 2016-11-10 ENCOUNTER — Other Ambulatory Visit: Payer: Self-pay | Admitting: Family Medicine

## 2016-11-10 DIAGNOSIS — I10 Essential (primary) hypertension: Secondary | ICD-10-CM

## 2017-01-15 DIAGNOSIS — Z23 Encounter for immunization: Secondary | ICD-10-CM | POA: Diagnosis not present

## 2017-05-02 IMAGING — DX DG HIP (WITH OR WITHOUT PELVIS) 2-3V*R*
3 series · 3 of 3 positions shown · non-contrast
Comparison: None in PACs

CLINICAL DATA: Right hip pain with limited range of motion

EXAM:
DG HIP (WITH OR WITHOUT PELVIS) 2-3V RIGHT

[pelvis ap]
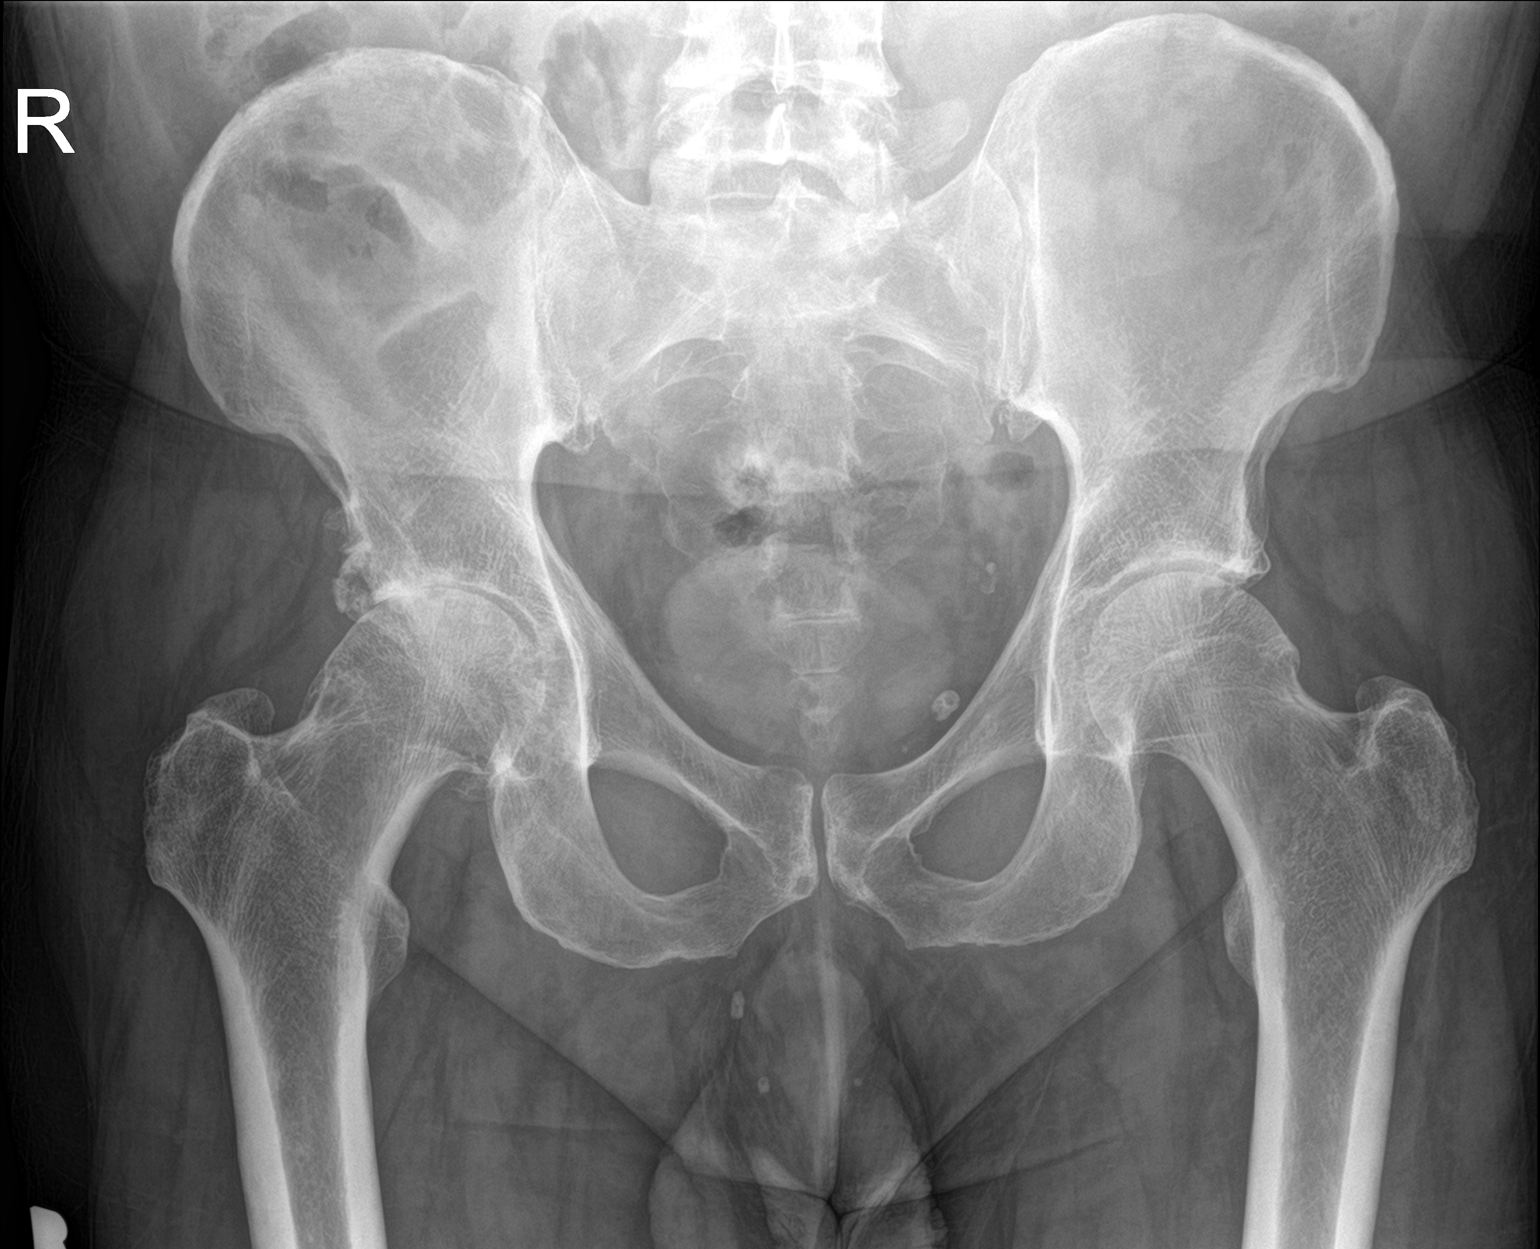

[hip ap]
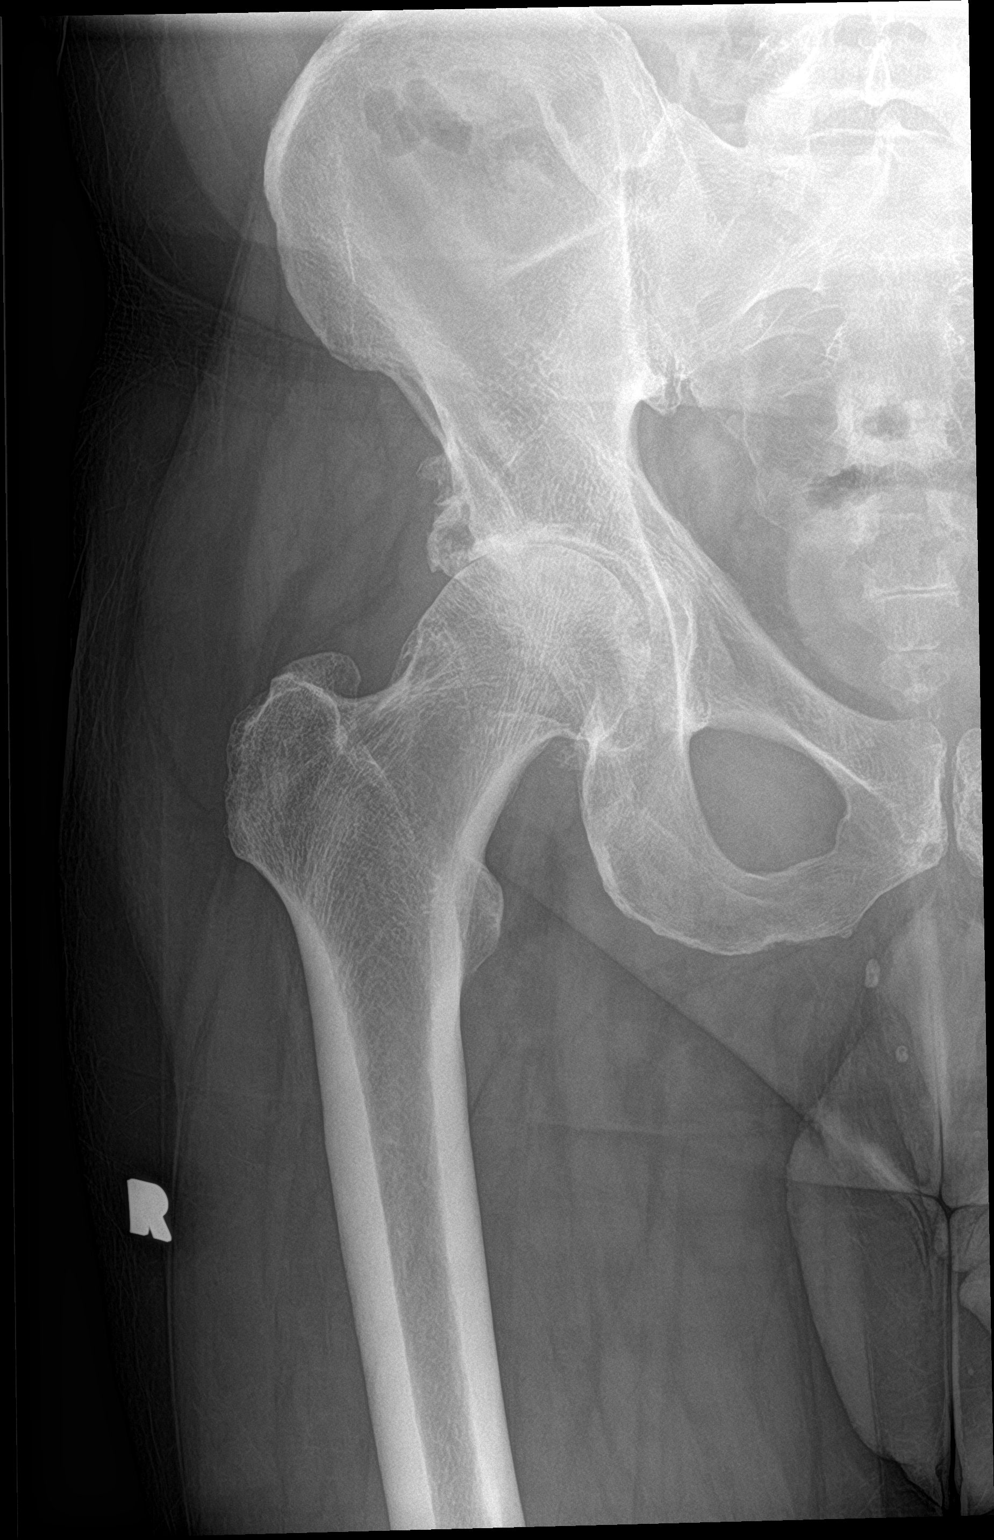

[hip frog leg]
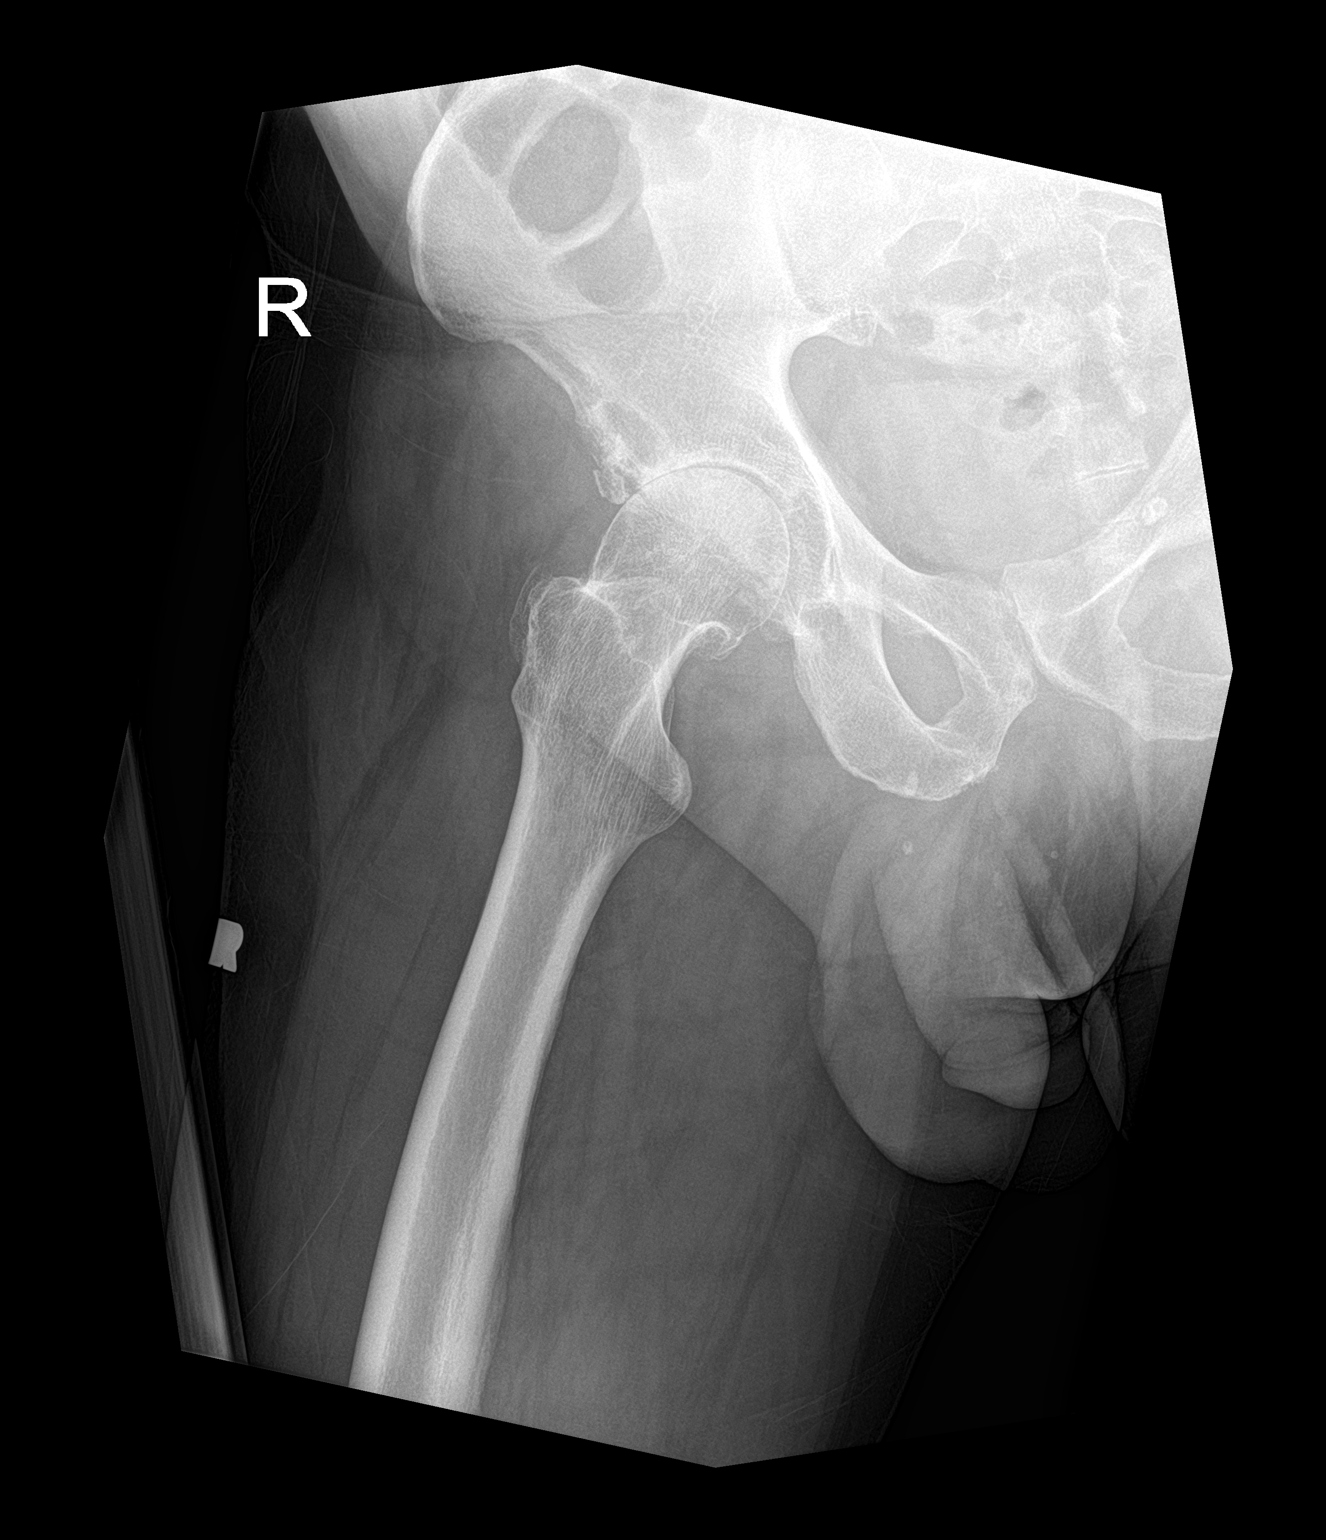

[3 of 3 positions shown; findings below may reference images not displayed]

FINDINGS: The bony pelvis is adequately mineralized. There is no lytic nor
blastic lesion. There is moderate to severe degenerative joint space
loss of the right hip. There is a bone on bone appearance
superiorly. No flattening of the femoral head is observed. There are
prominent osteophytes associated with the superior articular margin
of the acetabulum. The femoral neck, intertrochanteric, and
subtrochanteric regions are normal.
IMPRESSION: Moderate to severe osteoarthritic changes of the right hip with bone
on bone appearance. There is no acute bony abnormality.

## 2017-05-30 ENCOUNTER — Telehealth: Payer: Self-pay

## 2017-05-30 MED ORDER — DICLOFENAC SODIUM 1 % TD GEL: 4.0000 g | Freq: Four times a day (QID) | TRANSDERMAL | 99 refills | Status: DC

## 2017-05-30 NOTE — Telephone Encounter (Signed)
Refilled

## 2017-05-30 NOTE — Telephone Encounter (Signed)
Patient called in requesting Diclofenac for his hip pain. Patient gel can be electronically sent to [facility], [place].  Patient was advised request would be forwarded to provider for approval.

## 2017-06-01 ENCOUNTER — Other Ambulatory Visit: Payer: Self-pay

## 2017-06-01 MED ORDER — DICLOFENAC SODIUM 1 % TD GEL: 4.0000 g | Freq: Four times a day (QID) | TRANSDERMAL | 99 refills | Status: AC

## 2017-06-01 NOTE — Telephone Encounter (Signed)
Electronically resent diclofenac to [facility] [place]. Sent to [facility] in error.

## 2017-06-01 NOTE — Telephone Encounter (Signed)
Called patient - LMOVM in detail - Rx for Diclofenac electronically sent to [facility] [place]

## 2017-08-23 ENCOUNTER — Encounter: Payer: Self-pay | Admitting: Family Medicine

## 2017-09-27 ENCOUNTER — Telehealth: Payer: Self-pay

## 2017-09-27 NOTE — Telephone Encounter (Signed)
No additional encounter notes found. -[facility]/CCMA 

## 2017-09-27 NOTE — Telephone Encounter (Signed)
Pt stated that he was prescribed lexapro from [facility] downstairs. He wants to get off the lexapro because it doesn't let him sleep..he actually took his self off approximately 3 months ago, but wanted to let you know that he took some of his wife's xanax and it helps him, Pt hasn't seen you in a while but he wants to know if yo can send him some to the pharmacy.Pt stated that he took 0.5mg  pill and doesn't need a stronger dose.  Please advise. -[facility]/CCMA

## 2017-09-28 NOTE — Telephone Encounter (Signed)
Pt advised. OK with waiting until appt to discuss further

## 2017-09-28 NOTE — Telephone Encounter (Signed)
Attempted to contact Pt, phone number not in service.

## 2017-09-28 NOTE — Telephone Encounter (Signed)
Xanax is not a good long term medicine for most people as it increases risk of falls and confusion. We will discuss mood in more detail at the follow up visit in July.

## 2017-10-01 ENCOUNTER — Other Ambulatory Visit: Payer: Self-pay | Admitting: Family Medicine

## 2017-10-01 DIAGNOSIS — N400 Enlarged prostate without lower urinary tract symptoms: Secondary | ICD-10-CM

## 2017-10-01 DIAGNOSIS — I10 Essential (primary) hypertension: Secondary | ICD-10-CM

## 2017-10-19 ENCOUNTER — Encounter: Payer: Self-pay | Admitting: Family Medicine

## 2017-10-19 ENCOUNTER — Ambulatory Visit (INDEPENDENT_AMBULATORY_CARE_PROVIDER_SITE_OTHER): Payer: Medicare Other | Admitting: Family Medicine

## 2017-10-19 VITALS — BP 123/82 | HR 61 | Ht 70.0 in | Wt 189.0 lb

## 2017-10-19 DIAGNOSIS — Z23 Encounter for immunization: Secondary | ICD-10-CM | POA: Diagnosis not present

## 2017-10-19 DIAGNOSIS — D649 Anemia, unspecified: Secondary | ICD-10-CM | POA: Diagnosis not present

## 2017-10-19 DIAGNOSIS — Z0000 Encounter for general adult medical examination without abnormal findings: Secondary | ICD-10-CM | POA: Diagnosis not present

## 2017-10-19 DIAGNOSIS — N400 Enlarged prostate without lower urinary tract symptoms: Secondary | ICD-10-CM

## 2017-10-19 DIAGNOSIS — I10 Essential (primary) hypertension: Secondary | ICD-10-CM

## 2017-10-19 DIAGNOSIS — E782 Mixed hyperlipidemia: Secondary | ICD-10-CM

## 2017-10-19 DIAGNOSIS — Z6827 Body mass index (BMI) 27.0-27.9, adult: Secondary | ICD-10-CM | POA: Diagnosis not present

## 2017-10-19 DIAGNOSIS — R972 Elevated prostate specific antigen [PSA]: Secondary | ICD-10-CM

## 2017-10-19 NOTE — Progress Notes (Signed)
HPI: William Lane is a 67 y.o. male  who [facility] today, 10/19/17,  for Medicare Annual Wellness Exam  Patient presents for annual physical/Medicare wellness exam. no complaints today.   Past medical, surgical, social and family history reviewed:  Patient Active Problem List   Diagnosis Date Noted  . Anemia 10/19/2017  . Hypomania (HCC) 12/21/2015  . Depression 11/24/2015  . Status post total hip replacement, right 10/12/2015  . Degenerative disc disease, lumbar 03/23/2015  . History of colonic polyps 03/09/2015  . Vitamin D deficiency 08/06/2014  . Hyperlipidemia 01/12/2014  . Essential hypertension 01/12/2014  . Elevated PSA 01/12/2014  . BPH (benign prostatic hyperplasia) 01/12/2014    No past surgical history on file.  Social History   Socioeconomic History  . Marital status: Married    Spouse name: Not on file  . Number of children: Not on file  . Years of education: Not on file  . Highest education level: Not on file  Occupational History  . Not on file  Social Needs  . Financial resource strain: Not on file  . Food insecurity:    Worry: Not on file    Inability: Not on file  . Transportation needs:    Medical: Not on file    Non-medical: Not on file  Tobacco Use  . Smoking status: Never Smoker  . Smokeless tobacco: Never Used  Substance and Sexual Activity  . Alcohol use: Yes    Alcohol/week: 6.0 oz    Types: 10 Standard drinks or equivalent per week  . Drug use: No  . Sexual activity: Yes  Lifestyle  . Physical activity:    Days per week: Not on file    Minutes per session: Not on file  . Stress: Not on file  Relationships  . Social connections:    Talks on phone: Not on file    Gets together: Not on file    Attends religious service: Not on file    Active member of club or organization: Not on file    Attends meetings of clubs or organizations: Not on file    Relationship status: Not on  file  . Intimate partner violence:    Fear of current or ex partner: Not on file    Emotionally abused: Not on file    Physically abused: Not on file    Forced sexual activity: Not on file  Other Topics Concern  . Not on file  Social History Narrative  . Not on file    Family History  Problem Relation Age of Onset  . Alzheimer's disease Mother   . Hyperlipidemia Unknown      Current medication list and allergy/intolerance information reviewed:    Outpatient Encounter Medications as of 10/19/2017  Medication Sig  . aspirin 81 MG tablet Take 81 mg by mouth daily.  Marland Kitchen. atorvastatin (LIPITOR) 80 MG tablet Take 1 tablet (80 mg total) by mouth daily.  . cetirizine (ZYRTEC) 10 MG tablet Take 1 tablet (10 mg total) by mouth daily.  . diclofenac sodium (VOLTAREN) 1 % GEL Apply topically 4 (four) times daily.  Marland Kitchen. lisinopril (PRINIVIL,ZESTRIL) 20 MG tablet TAKE 1 TABLET BY MOUTH EVERY DAY  . niacin (NIASPAN) 1000 MG CR tablet TAKE 1 TABLET (1,000 MG TOTAL) BY MOUTH AT BEDTIME.  . tamsulosin (FLOMAX) 0.4 MG CAPS capsule TAKE 1 CAPSULE BY MOUTH EVERY DAY  . [DISCONTINUED] celecoxib (CELEBREX) 200 MG capsule Take 1 capsule (200 mg total)  by mouth 2 (two) times daily as needed for moderate pain. (Meloxicam ineffective)   No facility-administered encounter medications on file as of 10/19/2017.     Allergies  Allergen Reactions  . Codeine Other (See Comments)    hallucinations         Review of Systems: No headache, visual changes, nausea, vomiting, diarrhea, constipation, dizziness, abdominal pain, skin rash, fevers, chills, night sweats, weight loss, swollen lymph nodes, body aches, joint swelling, muscle aches, chest pain, shortness of breath, mood changes, visual or auditory hallucinations.     Medicare Wellness Questionnaire  Are there smokers in your home (other than you)? no  Depression screen [facility] 2/9 10/19/2017 08/23/2016 01/11/2016 01/11/2016 12/21/2015  Decreased Interest 0 0 0  0 0  Down, Depressed, Hopeless - 0 0 0 0  PHQ - 2 Score 0 0 0 0 0  Some encounter information is confidential and restricted. Go to Review Flowsheets activity to see all data.        Activities of Daily Living In your present state of health, do you have any difficulty performing the following activities?:  Driving? no Managing money?  no Feeding yourself? no Getting from bed to chair? no Climbing a flight of stairs? no Preparing food and eating?: no Bathing or showering? no Getting dressed: no Getting to the toilet? no Using the toilet: no Moving around from place to place: no In the past year have you fallen or had a near fall?: no  Hearing Difficulties:  Do you often ask people to speak up or repeat themselves? no Do you experience ringing or noises in your ears? no  Do you have difficulty understanding soft or whispered voices? no  Memory Difficulties:  Do you feel that you have a problem with memory? no  Do you often misplace items? no  Do you feel safe at home?  yes  Sexual Health:   Are you sexually active?  Yes  Do you have more than one partner?  No   Risk Factors  Current exercise habits: Yes -- stretching play golf 2x weekly walk. Play pickleball  Dietary issues discussed:Doing well  Cardiac risk factors: HLD, HTN   Exam:  BP 123/82   Pulse 61   Ht 5\' 10"  (1.778 m)   Wt 189 lb (85.7 kg)   BMI 27.12 kg/m  Vision by Snellen chart: right eye:see nurse notes, left eye:see nurse notes  Constitutional: VS see above. General Appearance: alert, well-developed, well-nourished, NAD  Ears, Nose, Mouth, Throat: MMM  Neck: No masses, trachea midline.   Respiratory: Normal respiratory effort. no wheeze, no rhonchi, no rales  Cardiovascular:No lower extremity edema.   Musculoskeletal: Gait normal. No clubbing/cyanosis of digits.   Neurological: Normal balance/coordination. No tremor. Recalls 3 objects and able to read face of watch with correct time.    Skin: warm, dry, intact. No rash/ulcer.   Psychiatric: Normal judgment/insight. Normal mood and affect. Oriented x3.     ASSESSMENT/PLAN:   Encounter for Medicare annual wellness exam  Essential hypertension - Plan: CBC, COMPLETE METABOLIC PANEL WITH GFR, Lipid Panel w/reflex Direct LDL, PSA, Fe+TIBC+Fer  Benign prostatic hyperplasia without lower urinary tract symptoms - Plan: CBC, COMPLETE METABOLIC PANEL WITH GFR, Lipid Panel w/reflex Direct LDL, PSA, Fe+TIBC+Fer  Mixed hyperlipidemia - Plan: CBC, COMPLETE METABOLIC PANEL WITH GFR, Lipid Panel w/reflex Direct LDL, PSA, Fe+TIBC+Fer  Elevated PSA - Plan: CBC, COMPLETE METABOLIC PANEL WITH GFR, Lipid Panel w/reflex Direct LDL, PSA, Fe+TIBC+Fer  Anemia, unspecified type - Plan:  CBC, COMPLETE METABOLIC PANEL WITH GFR, Lipid Panel w/reflex Direct LDL, PSA, Fe+TIBC+Fer  Health Maintenance Health Maintenance  Topic Date Due  . INFLUENZA VACCINE  11/01/2017  . COLONOSCOPY  03/08/2025  . TETANUS/TDAP  01/10/2026  . Hepatitis C Screening  Completed  . PNA vac Low Risk Adult  Completed    Immunization History  Administered Date(s) Administered  . Influenza, High Dose Seasonal PF 01/15/2017  . Influenza,inj,Quad PF,6+ Mos 11/24/2015  . Influenza-Unspecified 01/01/2014, 01/02/2015  . Pneumococcal Conjugate-13 10/19/2017  . Pneumococcal Polysaccharide-23 08/23/2016  . Tdap 01/11/2016  . Zoster 04/03/2012     During the course of the visit the patient was educated and counseled about appropriate screening and preventive services as noted above.   Patient Instructions (the written plan) was given to the patient.  Medicare Attestation I have personally reviewed: The patient's medical and social history Their use of alcohol, tobacco or illicit drugs Their current medications and supplements The patient's functional ability including ADLs,fall risks, home safety risks, cognitive, and hearing and visual impairment Diet and  physical activities Evidence for depression or mood disorders  The patient's weight, height, BMI, and visual acuity have been recorded in the chart.  I have made referrals, counseling, and provided education to the patient based on review of the above and I have provided the patient with a written personalized care plan for preventive services.

## 2017-10-19 NOTE — Patient Instructions (Signed)
Thank you for coming in today. Recheck in 1 year or sooner if needed.  I do recommend establishing care where you live.  We can send records there. Generally the office will request records from my office.   Get labs today.

## 2017-10-20 LAB — IRON,TIBC AND FERRITIN PANEL
%SAT: 33 % (calc) (ref 20–48)
Ferritin: 148 ng/mL (ref 24–380)
IRON: 102 ug/dL (ref 50–180)
TIBC: 306 mcg/dL (calc) (ref 250–425)

## 2017-10-20 LAB — COMPLETE METABOLIC PANEL WITH GFR
AG Ratio: 1.1 (calc) (ref 1.0–2.5)
ALBUMIN MSPROF: 4.4 g/dL (ref 3.6–5.1)
ALKALINE PHOSPHATASE (APISO): 61 U/L (ref 40–115)
ALT: 22 U/L (ref 9–46)
AST: 21 U/L (ref 10–35)
BILIRUBIN TOTAL: 0.8 mg/dL (ref 0.2–1.2)
BUN: 19 mg/dL (ref 7–25)
CHLORIDE: 101 mmol/L (ref 98–110)
CO2: 23 mmol/L (ref 20–32)
Calcium: 9.7 mg/dL (ref 8.6–10.3)
Creat: 1.21 mg/dL (ref 0.70–1.25)
GFR, Est African American: 72 mL/min/{1.73_m2} (ref >=60)
GFR, Est Non African American: 62 mL/min/{1.73_m2} (ref >=60)
GLOBULIN: 4.1 g/dL — AB (ref 1.9–3.7)
Glucose, Bld: 93 mg/dL (ref 65–99)
Potassium: 4.5 mmol/L (ref 3.5–5.3)
Sodium: 136 mmol/L (ref 135–146)
Total Protein: 8.5 g/dL — ABNORMAL HIGH (ref 6.1–8.1)

## 2017-10-20 LAB — LIPID PANEL W/REFLEX DIRECT LDL
CHOL/HDL RATIO: 4.4 (calc) (ref <5.0)
CHOLESTEROL: 217 mg/dL — AB (ref <200)
HDL: 49 mg/dL (ref >40)
LDL Cholesterol (Calc): 148 mg/dL (calc) — ABNORMAL HIGH
Non-HDL Cholesterol (Calc): 168 mg/dL (calc) — ABNORMAL HIGH (ref <130)
Triglycerides: 94 mg/dL (ref <150)

## 2017-10-20 LAB — CBC
HEMATOCRIT: 35.8 % — AB (ref 38.5–50.0)
Hemoglobin: 12.3 g/dL — ABNORMAL LOW (ref 13.2–17.1)
MCH: 31.2 pg (ref 27.0–33.0)
MCHC: 34.4 g/dL (ref 32.0–36.0)
MCV: 90.9 fL (ref 80.0–100.0)
MPV: 10 fL (ref 7.5–12.5)
Platelets: 173 10*3/uL (ref 140–400)
RBC: 3.94 10*6/uL — ABNORMAL LOW (ref 4.20–5.80)
RDW: 13.2 % (ref 11.0–15.0)
WBC: 4.5 10*3/uL (ref 3.8–10.8)

## 2017-10-20 LAB — PSA: PSA: 5.5 ng/mL — ABNORMAL HIGH (ref <=4.0)

## 2017-10-22 MED ORDER — EZETIMIBE 10 MG PO TABS: 10.0000 mg | ORAL_TABLET | Freq: Every day | ORAL | 3 refills | Status: AC

## 2017-10-22 NOTE — Addendum Note (Signed)
Addended by: Rodolph BongOREY, Assata Juncaj S on: 10/22/2017 06:58 AM   Modules accepted: Orders

## 2017-12-07 DIAGNOSIS — Z23 Encounter for immunization: Secondary | ICD-10-CM | POA: Diagnosis not present

## 2017-12-30 ENCOUNTER — Other Ambulatory Visit: Payer: Self-pay | Admitting: Family Medicine

## 2017-12-30 DIAGNOSIS — N400 Enlarged prostate without lower urinary tract symptoms: Secondary | ICD-10-CM

## 2017-12-30 DIAGNOSIS — I10 Essential (primary) hypertension: Secondary | ICD-10-CM

## 2018-02-11 ENCOUNTER — Encounter: Payer: Self-pay | Admitting: Family Medicine

## 2018-03-04 ENCOUNTER — Telehealth: Payer: Self-pay | Admitting: Family Medicine

## 2018-03-04 NOTE — Telephone Encounter (Signed)
Spoke with Pt, he got flu shot at pharmacy. Chart updated.

## 2018-03-04 NOTE — Telephone Encounter (Signed)
Our records indicate that you are due for an influenza vaccine. Because of your age group you are at higher risk for dying or being hospitalized from influenza. We can reduce this risk by giving a flu vaccine.   If you have already had a flu vaccine please let me know.   If you have not had a flu vaccine please call [phone] and schedule a nurse appointment. You do not need to see me to get a flu vaccine.   If you have any questions or concerns, please don't hesitate to call.  

## 2018-03-30 ENCOUNTER — Other Ambulatory Visit: Payer: Self-pay | Admitting: Family Medicine

## 2018-03-30 DIAGNOSIS — N400 Enlarged prostate without lower urinary tract symptoms: Secondary | ICD-10-CM

## 2018-03-30 DIAGNOSIS — I10 Essential (primary) hypertension: Secondary | ICD-10-CM

## 2018-04-16 ENCOUNTER — Other Ambulatory Visit: Payer: Self-pay

## 2018-04-16 MED ORDER — ATORVASTATIN CALCIUM 80 MG PO TABS: 80.0000 mg | ORAL_TABLET | Freq: Every day | ORAL | 1 refills | Status: AC

## 2018-07-10 ENCOUNTER — Other Ambulatory Visit: Payer: Self-pay | Admitting: Family Medicine

## 2018-07-10 DIAGNOSIS — I10 Essential (primary) hypertension: Secondary | ICD-10-CM

## 2018-07-10 DIAGNOSIS — N400 Enlarged prostate without lower urinary tract symptoms: Secondary | ICD-10-CM

## 2018-10-08 ENCOUNTER — Other Ambulatory Visit: Payer: Self-pay | Admitting: Family Medicine

## 2018-10-08 DIAGNOSIS — N400 Enlarged prostate without lower urinary tract symptoms: Secondary | ICD-10-CM

## 2018-10-08 DIAGNOSIS — I10 Essential (primary) hypertension: Secondary | ICD-10-CM

## 2018-10-21 ENCOUNTER — Encounter: Payer: Self-pay | Admitting: Family Medicine

## 2018-11-07 ENCOUNTER — Encounter: Payer: Medicare Other | Admitting: Family Medicine

## 2018-11-08 DIAGNOSIS — R55 Syncope and collapse: Secondary | ICD-10-CM | POA: Diagnosis not present

## 2018-11-08 DIAGNOSIS — R0689 Other abnormalities of breathing: Secondary | ICD-10-CM | POA: Diagnosis not present

## 2018-11-08 DIAGNOSIS — R0902 Hypoxemia: Secondary | ICD-10-CM | POA: Diagnosis not present

## 2018-11-08 DIAGNOSIS — R404 Transient alteration of awareness: Secondary | ICD-10-CM | POA: Diagnosis not present

## 2018-11-08 DIAGNOSIS — R410 Disorientation, unspecified: Secondary | ICD-10-CM | POA: Diagnosis not present
